# Patient Record
Sex: Male | Born: 1970
Health system: Southern US, Community
[De-identification: ages and names within clinical notes are randomized; demographics above are authoritative.]

## PROBLEM LIST (undated history)

## (undated) HISTORY — PX: APPENDECTOMY: SHX54

---

## 2014-11-13 ENCOUNTER — Emergency Department (HOSPITAL_COMMUNITY): Payer: BLUE CROSS/BLUE SHIELD

## 2014-11-13 ENCOUNTER — Emergency Department (HOSPITAL_COMMUNITY)
Admission: EM | Admit: 2014-11-13 | Discharge: 2014-11-13 | Disposition: A | Discharge status: Home / Self Care | Payer: BLUE CROSS/BLUE SHIELD | Attending: Emergency Medicine | Admitting: Emergency Medicine

## 2014-11-13 ENCOUNTER — Encounter (HOSPITAL_COMMUNITY): Payer: Self-pay

## 2014-11-13 DIAGNOSIS — R51 Headache: Secondary | ICD-10-CM | POA: Insufficient documentation

## 2014-11-13 DIAGNOSIS — R1110 Vomiting, unspecified: Secondary | ICD-10-CM | POA: Insufficient documentation

## 2014-11-13 DIAGNOSIS — R519 Headache, unspecified: Secondary | ICD-10-CM

## 2014-11-13 MED ORDER — DIPHENHYDRAMINE HCL 50 MG/ML IJ SOLN
25.0000 mg | Freq: Once | INTRAMUSCULAR | Status: AC
  Administered 2014-11-13: 25 mg via INTRAMUSCULAR
  Filled 2014-11-13: qty 1

## 2014-11-13 MED ORDER — KETOROLAC TROMETHAMINE 60 MG/2ML IM SOLN
60.0000 mg | Freq: Once | INTRAMUSCULAR | Status: AC
  Administered 2014-11-13: 60 mg via INTRAMUSCULAR
  Filled 2014-11-13: qty 2

## 2014-11-13 MED ORDER — METOCLOPRAMIDE HCL 5 MG/ML IJ SOLN
10.0000 mg | Freq: Once | INTRAMUSCULAR | Status: AC
  Administered 2014-11-13: 10 mg via INTRAMUSCULAR
  Filled 2014-11-13: qty 2

## 2014-11-13 NOTE — ED Provider Notes (Signed)
CSN: [id number]     Arrival date & time 11/13/14  2683 History   First MD Initiated Contact with Patient 11/13/14 [phone]     Chief Complaint  Patient presents with  . Headache     (Consider location/radiation/quality/duration/timing/severity/associated sxs/prior Treatment) HPI Comments: Pt comes in with c/o headache that has been ongoing for 15 days. He states that he has not had fever, sore throat, blurred vision. He does have vomiting. Has taken tylenol and motrin without relief. Has history of headache. States that he used glassed previously for the headaches for 6 months but then he stopped. He feels like his eyes are coming out when his headache is intense. Not acute in nature  The history is provided by the patient. A language interpreter was used.    History reviewed. No pertinent past medical history. Past Surgical History  Procedure Laterality Date  . Appendectomy     History reviewed. No pertinent family history. History  Substance Use Topics  . Smoking status: Never Smoker   . Smokeless tobacco: Not on file  . Alcohol Use: No    Review of Systems  All other systems reviewed and are negative.     Allergies  Review of patient's allergies indicates no known allergies.  Home Medications   Prior to Admission medications   Not on File   BP 139/91 mmHg  Pulse 77  Temp(Src) 98.8 F (37.1 C) (Oral)  Resp 20  Ht 5' 9"  (1.753 m)  Wt 159 lb 9.8 oz (72.4 kg)  BMI 23.56 kg/m2  SpO2 100% Physical Exam  Constitutional: He is oriented to person, place, and time. He appears well-developed and well-nourished.  HENT:  Head: Normocephalic and atraumatic.  Right Ear: External ear normal.  Left Ear: External ear normal.  Eyes: Conjunctivae and EOM are normal. Pupils are equal, round, and reactive to light.  Neck: Normal range of motion. Neck supple.  Cardiovascular: Normal rate and regular rhythm.   Pulmonary/Chest: Effort normal and breath sounds normal.  Abdominal:  Soft. Bowel sounds are normal.  Musculoskeletal: Normal range of motion.  Neurological: He is alert and oriented to person, place, and time. Coordination normal.  Skin: Skin is warm and dry.  Psychiatric: He has a normal mood and affect.  Nursing note and vitals reviewed.   ED Course  Procedures (including critical care time) Labs Review Labs Reviewed - No data to display  Imaging Review Ct Head Wo Contrast  11/13/2014   CLINICAL DATA:  Headache for 15 days.  Emesis.  EXAM: CT HEAD WITHOUT CONTRAST  TECHNIQUE: Contiguous axial images were obtained from the base of the skull through the vertex without intravenous contrast.  COMPARISON:  None.  FINDINGS: Sinuses/Soft tissues: Clear paranasal sinuses and mastoid air cells.  Intracranial: No mass lesion, hemorrhage, hydrocephalus, acute infarct, intra-axial, or extra-axial fluid collection.  IMPRESSION: Normal head CT.   Electronically Signed   By: Abigail Miyamoto M.D.   On: 11/13/2014 08:13     EKG Interpretation None      MDM   Final diagnoses:  Headache    Pt feeling better after migraine cocktail of toradol, reglan and benadryl. Not the worst headache or acute onset in nature. Discussed follow up       Glendell Docker, NP 11/13/14 2229  Ernestina Patches, MD 11/14/14 [phone]

## 2014-11-13 NOTE — ED Notes (Signed)
Pt here for headache for 15 days, pt reports emesis too. Pt sts he had glasses rx for 6 months and it helped but after 6 months they ran out and he no longer used them. Pt has taken tylenol and motrin for same.

## 2014-11-13 NOTE — Discharge Instructions (Signed)

## 2016-05-11 ENCOUNTER — Emergency Department (HOSPITAL_COMMUNITY)
Admission: EM | Admit: 2016-05-11 | Discharge: 2016-05-12 | Disposition: A | Discharge status: Home / Self Care | Payer: BLUE CROSS/BLUE SHIELD | Attending: Emergency Medicine | Admitting: Emergency Medicine

## 2016-05-11 ENCOUNTER — Encounter (HOSPITAL_COMMUNITY): Payer: Self-pay | Admitting: Emergency Medicine

## 2016-05-11 DIAGNOSIS — M545 Low back pain, unspecified: Secondary | ICD-10-CM

## 2016-05-11 LAB — I-STAT CHEM 8, ED
BUN: 5 mg/dL — ABNORMAL LOW (ref 6–20)
CALCIUM ION: 1.17 mmol/L (ref 1.15–1.40)
Chloride: 101 mmol/L (ref 101–111)
Creatinine, Ser: 0.9 mg/dL (ref 0.61–1.24)
Glucose, Bld: 82 mg/dL (ref 65–99)
HEMATOCRIT: 45 % (ref 39.0–52.0)
HEMOGLOBIN: 15.3 g/dL (ref 13.0–17.0)
Potassium: 3.4 mmol/L — ABNORMAL LOW (ref 3.5–5.1)
Sodium: 143 mmol/L (ref 135–145)
TCO2: 26 mmol/L (ref 0–100)

## 2016-05-11 MED ORDER — OXYCODONE-ACETAMINOPHEN 5-325 MG PO TABS
1.0000 | ORAL_TABLET | ORAL | Status: DC | PRN
  Administered 2016-05-11: 1 via ORAL

## 2016-05-11 MED ORDER — PREDNISONE 20 MG PO TABS
60.0000 mg | ORAL_TABLET | Freq: Once | ORAL | Status: AC
  Administered 2016-05-11: 60 mg via ORAL
  Filled 2016-05-11: qty 3

## 2016-05-11 MED ORDER — KETOROLAC TROMETHAMINE 60 MG/2ML IM SOLN
60.0000 mg | Freq: Once | INTRAMUSCULAR | Status: AC
  Administered 2016-05-11: 60 mg via INTRAMUSCULAR
  Filled 2016-05-11: qty 2

## 2016-05-11 MED ORDER — NAPROXEN 500 MG PO TABS: 500.0000 mg | ORAL_TABLET | Freq: Two times a day (BID) | ORAL | 0 refills | Status: DC

## 2016-05-11 MED ORDER — OXYCODONE-ACETAMINOPHEN 5-325 MG PO TABS
ORAL_TABLET | ORAL | Status: DC
Start: 2016-05-11 — End: 2016-05-12
  Filled 2016-05-11: qty 1

## 2016-05-11 MED ORDER — CYCLOBENZAPRINE HCL 10 MG PO TABS
5.0000 mg | ORAL_TABLET | Freq: Once | ORAL | Status: AC
  Administered 2016-05-11: 5 mg via ORAL
  Filled 2016-05-11: qty 1

## 2016-05-11 MED ORDER — CYCLOBENZAPRINE HCL 10 MG PO TABS: 5.0000 mg | ORAL_TABLET | Freq: Two times a day (BID) | ORAL | 0 refills | Status: DC | PRN

## 2016-05-11 NOTE — ED Triage Notes (Addendum)
Pt speaks Nepali. Pt reports this morning he went to work and began having left sided lower back pain radiating down to left leg. Denies any injury to back. Pt Hypertensive at triage. Denies history of HTN.

## 2016-05-11 NOTE — ED Provider Notes (Signed)
[facility] DEPT Provider Note   CSN: [id number] Arrival date & time: 05/11/16  1438     History   Chief Complaint Chief Complaint  Patient presents with  . Back Pain    HPI Duane Heath is a 45 y.o. male.  HPI   Patient presents to the ER with complaints of left low back pain. He speaks [place] and the interview was done with a translator phone.  He says it started this morning, He does not remember doing anything to hurt his back, but it hurts with movement only. He does not have pain at rest. Not had any dysuria, hematuria, abdominal pain, nausea, vomiting, diarrhea. It does not hurt to touch. He denies having a rash. He did not try anything at home for the pain prior to arrival.  Denies weakness but does say walking hurts. He is hypertensive in triage and denies hx.  History reviewed. No pertinent past medical history.  There are no active problems to display for this patient.   Past Surgical History:  Procedure Laterality Date  . APPENDECTOMY         Home Medications    Prior to Admission medications   Medication Sig Start Date End Date Taking? Authorizing Provider  cyclobenzaprine (FLEXERIL) 10 MG tablet Take 0.5-1 tablets (5-10 mg total) by mouth 2 (two) times daily as needed. 05/11/16   Devrin Monforte Carlota Raspberry, PA-C  naproxen (NAPROSYN) 500 MG tablet Take 1 tablet (500 mg total) by mouth 2 (two) times daily. 05/11/16   Delos Haring, PA-C    Family History No family history on file.  Social History Social History  Substance Use Topics  . Smoking status: Never Smoker  . Smokeless tobacco: Not on file  . Alcohol use No     Allergies   Review of patient's allergies indicates no known allergies.   Review of Systems Review of Systems   Physical Exam Updated Vital Signs BP (!) 153/103   Pulse 75   Temp 98.4 F (36.9 C) (Oral)   Resp 20   Ht 5' 3"  (1.6 m)   SpO2 100%   Physical Exam  Constitutional: He appears well-developed and well-nourished. No  distress.  HENT:  Head: Normocephalic and atraumatic.  Eyes: Pupils are equal, round, and reactive to light.  Neck: Normal range of motion. Neck supple.  Cardiovascular: Normal rate and regular rhythm.   Pulmonary/Chest: Effort normal.  Abdominal: Soft.  Musculoskeletal:  Symmetrical and physiologic strength to bilateral lower extremities.  Neurosensory function adequate to both legs Skin color is normal. Skin is warm and moist.  No step off deformity appreciated and no midline bony tenderness.  Ambulatory  No crepitus, laceration, effusion, induration, lesions Pedal pulses are symmetrical and palpable bilaterally   No tenderness to palpation of back or hips.    Neurological: He is alert.  Skin: Skin is warm and dry.  Nursing note and vitals reviewed.  Review of Systems All other systems negative except as documented in the HPI. All pertinent positives and negatives as reviewed in the HPI.   ED Treatments / Results  Labs (all labs ordered are listed, but only abnormal results are displayed) Labs Reviewed  I-STAT CHEM 8, ED - Abnormal; Notable for the following:       Result Value   Potassium 3.4 (*)    BUN 5 (*)    All other components within normal limits  URINALYSIS, ROUTINE W REFLEX MICROSCOPIC (NOT AT [facility])    EKG  EKG Interpretation None  Radiology No results found.  Procedures Procedures (including critical care time)  Medications Ordered in ED Medications  oxyCODONE-acetaminophen (PERCOCET/ROXICET) 5-325 MG per tablet 1 tablet (1 tablet Oral Given 05/11/16 1554)  oxyCODONE-acetaminophen (PERCOCET/ROXICET) 5-325 MG per tablet (not administered)  predniSONE (DELTASONE) tablet 60 mg (60 mg Oral Given 05/11/16 2332)  ketorolac (TORADOL) injection 60 mg (60 mg Intramuscular Given 05/11/16 2332)  cyclobenzaprine (FLEXERIL) tablet 5 mg (5 mg Oral Given 05/11/16 2332)     Initial Impression / Assessment and Plan / ED Course  I have reviewed the triage vital  signs and the nursing notes.  Pertinent labs & imaging results that were available during my care of the patient were reviewed by me and considered in my medical decision making (see chart for details).  Clinical Course    45 y.o.Duane Heath's  with back pain.   No neurological deficits and normal neuro exam. No loss of bowel or bladder control. No concern for cauda equina at this time base on HPI and physical exam findings. No fever, night sweats, weight loss, h/o cancer, IVDU. The patient can walk with some discomfort. --- He is advised to recheck his BP at the pharmacy when he is no longer hurting. If he is still hypertensive he has been given PCP referral and sdvised to make an appointment.  Patient Plan 1. Medications: NSAIDs and/or muscle relaxer. Cont usual home medications unless otherwise directed. 2. Treatment: rest, drink plenty of fluids, gentle stretching as discussed, alternate ice and heat  3. Follow Up: Please followup with your primary doctor for discussion of your diagnoses and further evaluation after today's visit; if you do not have a primary care doctor use the resource guide provided to find one  Advised to follow-up with the orthopedist if symptoms do not start to resolve in the next 2-3 days. If develop loss of bowel or urinary control return to the ED as soon as possible for further evaluation. To take the medications as prescribed as they can cause harm if not taken appropriately.   Vital signs are stable at discharge. Vitals:   05/11/16 2039 05/11/16 2247  BP: (!) 159/119 (!) 153/103  Pulse: 75 75  Resp: 20   Temp: 98.4 F (36.9 C)     Patient/guardian has voiced understanding and agreed to follow-up with the PCP or specialist.   Final Clinical Impressions(s) / ED Diagnoses   Final diagnoses:  Left-sided low back pain without sciatica    New Prescriptions New Prescriptions   CYCLOBENZAPRINE (FLEXERIL) 10 MG TABLET    Take 0.5-1 tablets (5-10 mg total)  by mouth 2 (two) times daily as needed.   NAPROXEN (NAPROSYN) 500 MG TABLET    Take 1 tablet (500 mg total) by mouth 2 (two) times daily.      Delos Haring, PA-C 05/12/16 0150    Duffy Bruce, MD 05/12/16 [phone]

## 2016-05-12 LAB — URINALYSIS, ROUTINE W REFLEX MICROSCOPIC
BILIRUBIN URINE: NEGATIVE
GLUCOSE, UA: NEGATIVE mg/dL
Hgb urine dipstick: NEGATIVE
KETONES UR: NEGATIVE mg/dL
LEUKOCYTES UA: NEGATIVE
Nitrite: NEGATIVE
PROTEIN: NEGATIVE mg/dL
Specific Gravity, Urine: 1.008 (ref 1.005–1.030)
pH: 7.5 (ref 5.0–8.0)

## 2016-09-22 ENCOUNTER — Ambulatory Visit: Payer: BLUE CROSS/BLUE SHIELD | Admitting: Family Medicine

## 2016-12-19 ENCOUNTER — Ambulatory Visit (INDEPENDENT_AMBULATORY_CARE_PROVIDER_SITE_OTHER): Payer: BLUE CROSS/BLUE SHIELD | Admitting: Physician Assistant

## 2017-01-18 ENCOUNTER — Other Ambulatory Visit: Payer: Self-pay

## 2017-01-18 ENCOUNTER — Emergency Department (HOSPITAL_COMMUNITY): Payer: BLUE CROSS/BLUE SHIELD

## 2017-01-18 ENCOUNTER — Emergency Department (HOSPITAL_COMMUNITY)
Admission: EM | Admit: 2017-01-18 | Discharge: 2017-01-18 | Disposition: A | Discharge status: Home / Self Care | Payer: BLUE CROSS/BLUE SHIELD | Attending: Emergency Medicine | Admitting: Emergency Medicine

## 2017-01-18 DIAGNOSIS — R55 Syncope and collapse: Secondary | ICD-10-CM | POA: Insufficient documentation

## 2017-01-18 DIAGNOSIS — Z79899 Other long term (current) drug therapy: Secondary | ICD-10-CM | POA: Insufficient documentation

## 2017-01-18 DIAGNOSIS — R42 Dizziness and giddiness: Secondary | ICD-10-CM | POA: Diagnosis present

## 2017-01-18 DIAGNOSIS — R030 Elevated blood-pressure reading, without diagnosis of hypertension: Secondary | ICD-10-CM | POA: Diagnosis not present

## 2017-01-18 LAB — BASIC METABOLIC PANEL
Anion gap: 8 (ref 5–15)
BUN: 7 mg/dL (ref 6–20)
CO2: 24 mmol/L (ref 22–32)
Calcium: 9.6 mg/dL (ref 8.9–10.3)
Chloride: 107 mmol/L (ref 101–111)
Creatinine, Ser: 0.77 mg/dL (ref 0.61–1.24)
GFR calc Af Amer: >60 mL/min (ref >60)
GFR calc non Af Amer: >60 mL/min (ref >60)
GLUCOSE: 83 mg/dL (ref 65–99)
Potassium: 3.7 mmol/L (ref 3.5–5.1)
SODIUM: 139 mmol/L (ref 135–145)

## 2017-01-18 LAB — URINALYSIS, ROUTINE W REFLEX MICROSCOPIC
Bilirubin Urine: NEGATIVE
GLUCOSE, UA: NEGATIVE mg/dL
HGB URINE DIPSTICK: NEGATIVE
Ketones, ur: NEGATIVE mg/dL
Leukocytes, UA: NEGATIVE
Nitrite: NEGATIVE
PROTEIN: NEGATIVE mg/dL
Specific Gravity, Urine: 1.002 — ABNORMAL LOW (ref 1.005–1.030)
pH: 7 (ref 5.0–8.0)

## 2017-01-18 LAB — CBC WITH DIFFERENTIAL/PLATELET
BASOS PCT: 0 %
Basophils Absolute: 0 10*3/uL (ref 0.0–0.1)
EOS ABS: 0.1 10*3/uL (ref 0.0–0.7)
Eosinophils Relative: 2 %
HCT: 42.1 % (ref 39.0–52.0)
Hemoglobin: 15.2 g/dL (ref 13.0–17.0)
Lymphocytes Relative: 25 %
Lymphs Abs: 1.6 10*3/uL (ref 0.7–4.0)
MCH: 28.9 pg (ref 26.0–34.0)
MCHC: 36.1 g/dL — ABNORMAL HIGH (ref 30.0–36.0)
MCV: 80 fL (ref 78.0–100.0)
Monocytes Absolute: 0.4 10*3/uL (ref 0.1–1.0)
Monocytes Relative: 6 %
Neutro Abs: 4.3 10*3/uL (ref 1.7–7.7)
Neutrophils Relative %: 67 %
Platelets: 260 10*3/uL (ref 150–400)
RBC: 5.26 MIL/uL (ref 4.22–5.81)
RDW: 11.8 % (ref 11.5–15.5)
WBC: 6.4 10*3/uL (ref 4.0–10.5)

## 2017-01-18 LAB — I-STAT TROPONIN, ED: Troponin i, poc: 0 ng/mL (ref 0.00–0.08)

## 2017-01-18 MED ORDER — SODIUM CHLORIDE 0.9 % IV BOLUS (SEPSIS)
1000.0000 mL | Freq: Once | INTRAVENOUS | Status: AC
  Administered 2017-01-18: 1000 mL via INTRAVENOUS

## 2017-01-18 NOTE — ED Triage Notes (Signed)
Pt BIB [facility] from work where pt reports sudden on set of dizziness with a funny feeling in chest, pt Denies CP. EMS reports pt was hypertensive and diaphoretic upon arrival. Pt denies medical history, denies allergies, denies daily medications.

## 2017-01-18 NOTE — ED Notes (Signed)
Patient transported to X-ray 

## 2017-01-18 NOTE — ED Provider Notes (Signed)
[facility] DEPT Provider Note   CSN: [id number] Arrival date & time: 01/18/17  1224     History   Chief Complaint Chief Complaint  Patient presents with  . Dizziness    HPI Duane Heath is a 46 y.o. male.  Duane Heath is a 46 y.o. Male who reports to the emergency department via EMS after a near syncopal episode prior to arrival today. 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 He tells me he felt like he might pass out. He did not fall down or lose consciousness. He told EMS he had "funny feeling" in his chest but denied chest pain. With me he denies chest pain or shortness of breath. He reports currently his symptoms have resolved. He denies room spinning dizziness. No treatments attempted prior to arrival. He denies fevers, headache, recent illness, recent long travel, smoking, history of MI, numbness, weakness, double vision, abdominal pain, vomiting, diarrhea, chest pain, palpitations or rashes.   The history is provided by the patient and medical records. The history is limited by a language barrier. A language interpreter was used.  Dizziness  Associated symptoms: no chest pain, no diarrhea, no headaches, no nausea, no palpitations, no shortness of breath, no vomiting and no weakness     No past medical history on file.  There are no active problems to display for this patient.   Past Surgical History:  Procedure Laterality Date  . APPENDECTOMY         Home Medications    Prior to Admission medications   Medication Sig Start Date End Date Taking? Authorizing Provider  cyclobenzaprine (FLEXERIL) 10 MG tablet Take 0.5-1 tablets (5-10 mg total) by mouth 2 (two) times daily as needed. Patient not taking: Reported on 01/18/2017 05/11/16   Delos Haring, PA-C  naproxen (NAPROSYN) 500 MG tablet Take 1 tablet (500 mg total) by mouth 2 (two) times daily. Patient not taking: Reported on 01/18/2017 05/11/16    Delos Haring, PA-C    Family History No family history on file.  Social History Social History  Substance Use Topics  . Smoking status: Never Smoker  . Smokeless tobacco: Not on file  . Alcohol use No     Allergies   Patient has no known allergies.   Review of Systems Review of Systems  Constitutional: Negative for chills and fever.  HENT: Negative for congestion and sore throat.   Eyes: Negative for visual disturbance.  Respiratory: Negative for cough and shortness of breath.   Cardiovascular: Negative for chest pain and palpitations.  Gastrointestinal: Negative for abdominal pain, diarrhea, nausea and vomiting.  Genitourinary: Negative for dysuria.  Musculoskeletal: Negative for back pain and neck pain.  Skin: Negative for rash.  Neurological: Positive for syncope (Near syncope.) and light-headedness. Negative for dizziness, speech difficulty, weakness, numbness and headaches.     Physical Exam Updated Vital Signs BP (!) 148/102   Pulse 74   Temp 98.4 F (36.9 C) (Oral)   Resp 14   Ht 5' 4"  (1.626 m)   Wt 72.4 kg   SpO2 96%   BMI 27.40 kg/m   Physical Exam  Constitutional: He is oriented to person, place, and time. He appears well-developed and well-nourished. No distress.  Nontoxic appearing.  HENT:  Head: Normocephalic and atraumatic.  Right Ear: External ear normal.  Left Ear: External ear normal.  Mouth/Throat: Oropharynx is clear and moist.  Eyes: Conjunctivae and EOM are normal. Pupils are  equal, round, and reactive to light. Right eye exhibits no discharge. Left eye exhibits no discharge.  Neck: Normal range of motion. Neck supple. No JVD present. No tracheal deviation present.  Cardiovascular: Normal rate, regular rhythm, normal heart sounds and intact distal pulses.  Exam reveals no gallop and no friction rub.   No murmur heard. Bilateral radial pulses are intact.  Pulmonary/Chest: Effort normal and breath sounds normal. No stridor. No  respiratory distress. He has no wheezes. He has no rales.  Lungs are clear to ascultation bilaterally. Symmetric chest expansion bilaterally. No increased work of breathing. No rales or rhonchi.    Abdominal: Soft. There is no tenderness. There is no guarding.  Musculoskeletal: Normal range of motion. He exhibits no edema or tenderness.  Lymphadenopathy:    He has no cervical adenopathy.  Neurological: He is alert and oriented to person, place, and time. No cranial nerve deficit or sensory deficit. He exhibits normal muscle tone. Coordination normal.  Patient is alert and oriented 3. Cranial nerves are intact. Speech is clear and coherent. No pronator drift. Finger to nose intact bilaterally. Good strength and sensation to his bilateral upper and lower extremities.  Skin: Skin is warm and dry. Capillary refill takes less than 2 seconds. No rash noted. He is not diaphoretic. No erythema. No pallor.  Psychiatric: He has a normal mood and affect. His behavior is normal.  Nursing note and vitals reviewed.    ED Treatments / Results  Labs (all labs ordered are listed, but only abnormal results are displayed) Labs Reviewed  CBC WITH DIFFERENTIAL/PLATELET - Abnormal; Notable for the following:       Result Value   MCHC 36.1 (*)    All other components within normal limits  URINALYSIS, ROUTINE W REFLEX MICROSCOPIC - Abnormal; Notable for the following:    Color, Urine COLORLESS (*)    Specific Gravity, Urine 1.002 (*)    All other components within normal limits  BASIC METABOLIC PANEL  I-STAT TROPOININ, ED    EKG  EKG Interpretation  Date/Time:  Thursday Jan 18 2017 13:01:32 EDT Ventricular Rate:  67 PR Interval:    QRS Duration: 85 QT Interval:  376 QTC Calculation: 397 R Axis:   49 Text Interpretation:  Sinus rhythm Borderline repolarization abnormality No significant change since last tracing Confirmed by Theotis Burrow [id number]) on 01/18/2017 3:59:13 PM       Radiology Dg  Chest 2 View  Result Date: 01/18/2017 CLINICAL DATA:  Dizziness EXAM: CHEST  2 VIEW COMPARISON:  None. FINDINGS: The heart size and mediastinal contours are within normal limits. Both lungs are clear. The visualized skeletal structures are unremarkable. IMPRESSION: No active cardiopulmonary disease. Electronically Signed   By: Inez Catalina M.D.   On: 01/18/2017 13:40    Procedures Procedures (including critical care time)  Medications Ordered in ED Medications  sodium chloride 0.9 % bolus 1,000 mL (1,000 mLs Intravenous New Bag/Given 01/18/17 1429)     Initial Impression / Assessment and Plan / ED Course  I have reviewed the triage vital signs and the nursing notes.  Pertinent labs & imaging results that were available during my care of the patient were reviewed by me and considered in my medical decision making (see chart for details).    This is a 46 y.o. Male who reports to the emergency department via EMS after a near syncopal episode prior to arrival today. 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 He tells me he felt like he might pass out. He did not fall down or lose consciousness. He told EMS he had "funny feeling" in his chest but denied chest pain. With me he denies chest pain or shortness of breath. He reports currently his symptoms have resolved. He denies room spinning dizziness.  On exam the patient is afebrile nontoxic appearing. He has no focal neurological deficits. EKG is unchanged from his last tracing. No STEMI. He reports no symptoms currently. He is hypertensive. Troponin is not elevated. Urinalysis is without sign of infection. BMP is within normal limits. CBC is unremarkable. No leukocytosis. Chest x-ray is unremarkable. The patient is not orthostatic. He is PERC negative and Wells negative. I have low suspicion for ACS, or PE in this patient. Work up is reassuring. At reevaluation patient reports  feeling better. He still denies any current complaints. He ambulates with normal gait and without symptoms of lightheadedness. He feels ready for discharge. We'll discharge at this time. I did encourage him to follow-up with primary care due to his elevated blood pressure. I discussed strict and specific return precautions. I advised the patient to follow-up with their primary care provider this week. I advised the patient to return to the emergency department with new or worsening symptoms or new concerns. The patient verbalized understanding and agreement with plan.      Final Clinical Impressions(s) / ED Diagnoses   Final diagnoses:  Near syncope  Elevated blood pressure reading    New Prescriptions New Prescriptions   No medications on file     Waynetta Pean, Hershal Coria 01/18/17 [place], Wenda Overland, MD 01/18/17 [phone]

## 2017-01-23 ENCOUNTER — Encounter (INDEPENDENT_AMBULATORY_CARE_PROVIDER_SITE_OTHER): Payer: Self-pay | Admitting: Physician Assistant

## 2017-01-23 ENCOUNTER — Ambulatory Visit (INDEPENDENT_AMBULATORY_CARE_PROVIDER_SITE_OTHER): Payer: BLUE CROSS/BLUE SHIELD | Admitting: Physician Assistant

## 2017-01-23 VITALS — BP 142/95 | HR 76 | Temp 97.6°F | Ht 67.0 in | Wt 197.8 lb

## 2017-01-23 DIAGNOSIS — R350 Frequency of micturition: Secondary | ICD-10-CM

## 2017-01-23 DIAGNOSIS — I10 Essential (primary) hypertension: Secondary | ICD-10-CM | POA: Diagnosis not present

## 2017-01-23 DIAGNOSIS — Z114 Encounter for screening for human immunodeficiency virus [HIV]: Secondary | ICD-10-CM

## 2017-01-23 DIAGNOSIS — Z09 Encounter for follow-up examination after completed treatment for conditions other than malignant neoplasm: Secondary | ICD-10-CM | POA: Diagnosis not present

## 2017-01-23 LAB — POCT URINALYSIS DIPSTICK
BILIRUBIN UA: NEGATIVE
Glucose, UA: NEGATIVE
KETONES UA: NEGATIVE
LEUKOCYTES UA: NEGATIVE
NITRITE UA: NEGATIVE
PH UA: 7 (ref 5.0–8.0)
Spec Grav, UA: 1.015 (ref 1.010–1.025)
Urobilinogen, UA: 0.2 E.U./dL

## 2017-01-23 MED ORDER — HYDROCHLOROTHIAZIDE 25 MG PO TABS: 25.0000 mg | ORAL_TABLET | Freq: Every day | ORAL | 1 refills | Status: DC

## 2017-01-23 NOTE — Progress Notes (Signed)
  Subjective:  Patient ID: Duane Heath, male    DOB: 12/18/1970  Age: 46 y.o. MRN: [id number]  CC: ED f/u for near syncope  HPI Duane Heath is a 46 y.o. male with no significant PMH presents for f/u of ED visit on 01/18/17. Diagnosed with near syncope. Labs, CXR, and EKG normal/unremarkable at the ED. Has no current sxs that led him to the ED. Only a mild HA since ED and urinary frequency of 5-6 per day. Here to address his blood pressure. Denies chest pain, palpitations, SOB, abdominal pain, rash, f/c/n/v, or GI/GU sxs.   *[place] interpreter used during the visit.   ROS Review of Systems  Constitutional: Negative for chills, fever and malaise/fatigue.  Eyes: Negative for blurred vision.  Respiratory: Negative for shortness of breath.   Cardiovascular: Negative for chest pain, palpitations, orthopnea, claudication, leg swelling and PND.  Gastrointestinal: Negative for abdominal pain and nausea.  Genitourinary: Positive for frequency. Negative for dysuria, flank pain, hematuria and urgency.  Musculoskeletal: Negative for back pain, joint pain, myalgias and neck pain.  Skin: Negative for rash.  Neurological: Positive for headaches. Negative for tingling.  Psychiatric/Behavioral: Negative for depression. The patient is not nervous/anxious.     Objective:  BP (!) 142/95   Pulse 76   Temp 97.6 F (36.4 C) (Oral)   Ht 5' 7"  (1.702 m)   Wt 197 lb 12.8 oz (89.7 kg)   SpO2 98%   BMI 30.98 kg/m   BP/Weight 01/23/2017 7/65/4650 11/07/4654  Systolic BP 812 751 700  Diastolic BP 95 78 174  Wt. (Lbs) 197.8 159.61 -  BMI 30.98 27.4 -      Physical Exam  Constitutional: He is oriented to person, place, and time.  Well developed, overweight, NAD, polite  HENT:  Head: Normocephalic and atraumatic.  Eyes: No scleral icterus.  Neck: Normal range of motion. Neck supple. No thyromegaly present.  Cardiovascular: Normal rate, regular rhythm and normal heart sounds.   Pulmonary/Chest: Effort normal  and breath sounds normal.  Abdominal: Soft. Bowel sounds are normal. There is no tenderness.  Musculoskeletal: He exhibits no edema.  Lymphadenopathy:    He has no cervical adenopathy.  Neurological: He is alert and oriented to person, place, and time. No cranial nerve deficit. Coordination normal.  Skin: Skin is warm and dry. No rash noted. No erythema. No pallor.  Psychiatric: He has a normal mood and affect. His behavior is normal. Thought content normal.  Vitals reviewed.    Assessment & Plan:   1. Hypertension, unspecified type - HIV antibody - Hepatic Function Panel - TSH - Begin HCTZ 25 q am  2. Hospital discharge follow-up - HIV antibody - Hepatic Function Panel - ED notes read  3. Encounter for screening for HIV - HIV antibody  4. Urinary frequency - Urinalysis Dipstick positive for trace protein and trace blood. Recommend retest in 2-4 weeks.   Meds ordered this encounter  Medications  . hydrochlorothiazide (HYDRODIURIL) 25 MG tablet    Sig: Take 1 tablet (25 mg total) by mouth daily. Take on tablet in the morning.    Dispense:  90 tablet    Refill:  1    Order Specific Question:   Supervising Provider    Answer:   Duane Heath [id number]    Follow-up: Return in about 2 weeks (around 02/06/2017).   [name]

## 2017-01-23 NOTE — Patient Instructions (Signed)

## 2017-01-24 LAB — HEPATIC FUNCTION PANEL
ALK PHOS: 77 IU/L (ref 39–117)
ALT: 59 IU/L — ABNORMAL HIGH (ref 0–44)
AST: 51 IU/L — AB (ref 0–40)
Albumin: 4.7 g/dL (ref 3.5–5.5)
Bilirubin Total: 0.8 mg/dL (ref 0.0–1.2)
Bilirubin, Direct: 0.23 mg/dL (ref 0.00–0.40)
Total Protein: 7.7 g/dL (ref 6.0–8.5)

## 2017-01-24 LAB — TSH: TSH: 1.97 u[IU]/mL (ref 0.450–4.500)

## 2017-01-24 LAB — HIV ANTIBODY (ROUTINE TESTING W REFLEX): HIV Screen 4th Generation wRfx: NONREACTIVE

## 2017-02-01 ENCOUNTER — Encounter (INDEPENDENT_AMBULATORY_CARE_PROVIDER_SITE_OTHER): Payer: Self-pay

## 2017-02-13 ENCOUNTER — Ambulatory Visit (INDEPENDENT_AMBULATORY_CARE_PROVIDER_SITE_OTHER): Payer: BLUE CROSS/BLUE SHIELD | Admitting: Physician Assistant

## 2017-03-08 ENCOUNTER — Ambulatory Visit (INDEPENDENT_AMBULATORY_CARE_PROVIDER_SITE_OTHER): Payer: BLUE CROSS/BLUE SHIELD | Admitting: Physician Assistant

## 2017-03-12 ENCOUNTER — Encounter (INDEPENDENT_AMBULATORY_CARE_PROVIDER_SITE_OTHER): Payer: Self-pay | Admitting: Physician Assistant

## 2017-03-12 ENCOUNTER — Ambulatory Visit (INDEPENDENT_AMBULATORY_CARE_PROVIDER_SITE_OTHER): Payer: BLUE CROSS/BLUE SHIELD | Admitting: Physician Assistant

## 2017-03-12 VITALS — BP 100/65 | HR 85 | Temp 98.4°F | Wt 197.8 lb

## 2017-03-12 DIAGNOSIS — Z23 Encounter for immunization: Secondary | ICD-10-CM

## 2017-03-12 DIAGNOSIS — I10 Essential (primary) hypertension: Secondary | ICD-10-CM

## 2017-03-12 MED ORDER — HYDROCHLOROTHIAZIDE 25 MG PO TABS: 25.0000 mg | ORAL_TABLET | Freq: Every day | ORAL | 1 refills | Status: DC

## 2017-03-12 NOTE — Progress Notes (Signed)
   Subjective:  Patient ID: Duane Heath, male    DOB: May 30, 1971  Age: 46 y.o. MRN: [id number]  CC: HTN  HPI Duane Heath is a 46 y.o. male with a PMH of HTN presents to f/u on HTN. Has been taking HCTZ 25 mg as directed. Does not check BP at home. Feels well and is without complaints. Denies CP, palpitations, SOB, HA, abdominal pain, fever, chills, nausea, vomiting, rash, edema, presyncope, syncope, dizziness, tingling, numbness, or GI/GU sxs.    Outpatient Medications Prior to Visit  Medication Sig Dispense Refill  . hydrochlorothiazide (HYDRODIURIL) 25 MG tablet Take 1 tablet (25 mg total) by mouth daily. Take on tablet in the morning. 90 tablet 1   No facility-administered medications prior to visit.      ROS Review of Systems  Constitutional: Negative for chills, fever and malaise/fatigue.  Eyes: Negative for blurred vision.  Respiratory: Negative for shortness of breath.   Cardiovascular: Negative for chest pain and palpitations.  Gastrointestinal: Negative for abdominal pain and nausea.  Genitourinary: Negative for dysuria and hematuria.  Musculoskeletal: Negative for joint pain and myalgias.  Skin: Negative for rash.  Neurological: Negative for tingling and headaches.  Psychiatric/Behavioral: Negative for depression. The patient is not nervous/anxious.     Objective:  BP 100/65 (BP Location: Left Arm, Patient Position: Sitting, Cuff Size: Normal)   Pulse 85   Temp 98.4 F (36.9 C) (Oral)   Wt 197 lb 12.8 oz (89.7 kg)   SpO2 97%   BMI 30.98 kg/m   BP/Weight 03/12/2017 01/23/2017 04/23/8137  Systolic BP 871 959 747  Diastolic BP 65 95 78  Wt. (Lbs) 197.8 197.8 159.61  BMI 30.98 30.98 27.4      Physical Exam  Constitutional: He is oriented to person, place, and time.  Well developed, overweight, NAD, polite  HENT:  Head: Normocephalic and atraumatic.  Eyes: No scleral icterus.  Neck: Normal range of motion. Neck supple. No thyromegaly present.  Cardiovascular:  Normal rate, regular rhythm and normal heart sounds.   Pulmonary/Chest: Effort normal and breath sounds normal.  Abdominal: Soft. Bowel sounds are normal. There is no tenderness.  Musculoskeletal: He exhibits no edema.  Neurological: He is alert and oriented to person, place, and time.  Skin: Skin is warm and dry. No rash noted. No erythema. No pallor.  Psychiatric: He has a normal mood and affect. His behavior is normal. Thought content normal.  Vitals reviewed.    Assessment & Plan:   1. Essential hypertension - hydrochlorothiazide (HYDRODIURIL) 25 MG tablet; Take 1 tablet (25 mg total) by mouth daily. Take on tablet in the morning.  Dispense: 90 tablet; Refill: 1  2. Need for Tdap vaccination - Tdap vaccine greater than or equal to 7yo IM   Meds ordered this encounter  Medications  . hydrochlorothiazide (HYDRODIURIL) 25 MG tablet    Sig: Take 1 tablet (25 mg total) by mouth daily. Take on tablet in the morning.    Dispense:  90 tablet    Refill:  1    Order Specific Question:   Supervising Provider    Answer:   Tresa Garter [id number]    Follow-up: Return in about 6 months (around 09/12/2017) for BP.   [name]

## 2017-03-12 NOTE — Patient Instructions (Signed)
Hypotension Hypotension, commonly called low blood pressure, is when the force of blood pumping through your arteries is too weak. Arteries are blood vessels that carry blood from the heart throughout the body. When blood pressure is too low, you may not get enough blood to your brain or to the rest of your organs. This can cause weakness, light-headedness, rapid heartbeat, and fainting. Depending on the cause and severity, hypotension may be harmless (benign) or cause serious problems (critical). What are the causes? Possible causes of hypotension include:  Blood loss.  Loss of body fluids (dehydration).  Heart problems.  Hormone (endocrine) problems.  Pregnancy.  Severe infection.  Lack of certain nutrients.  Severe allergic reactions (anaphylaxis).  Certain medicines, such as blood pressure medicine or medicines that make the body lose excess fluids (diuretics). Sometimes, hypotension can be caused by not taking medicine as directed, such as taking too much of a certain medicine.  What increases the risk? Certain factors can make you more likely to develop hypotension, including:  Age. Risk increases as you get older.  Conditions that affect the heart or the central nervous system.  Taking certain medicines, such as blood pressure medicine or diuretics.  Being pregnant.  What are the signs or symptoms? Symptoms of this condition may include:  Weakness.  Light-headedness.  Dizziness.  Blurred vision.  Fatigue.  Rapid heartbeat.  Fainting, in severe cases.  How is this diagnosed? This condition is diagnosed based on:  Your medical history.  Your symptoms.  Your blood pressure measurement. Your health care provider will check your blood pressure when you are: ? Lying down. ? Sitting. ? Standing.  A blood pressure reading is recorded as two numbers, such as "120 over 80" (or 120/80). The first ("top") number is called the systolic pressure. It is a  measure of the pressure in your arteries as your heart beats. The second ("bottom") number is called the diastolic pressure. It is a measure of the pressure in your arteries when your heart relaxes between beats. Blood pressure is measured in a unit called mm Hg. Healthy blood pressure for adults is 120/80. If your blood pressure is below 90/60, you may be diagnosed with hypotension. Other information or tests that may be used to diagnose hypotension include:  Your other vital signs, such as your heart rate and temperature.  Blood tests.  Tilt table test. For this test, you will be safely secured to a table that moves you from a lying position to an upright position. Your heart rhythm and blood pressure will be monitored during the test.  How is this treated? Treatment for this condition may include:  Changing your diet. This may involve eating more salt (sodium) or drinking more water.  Taking medicines to raise your blood pressure.  Changing the dosage of certain medicines you are taking that might be lowering your blood pressure.  Wearing compression stockings. These stockings help to prevent blood clots and reduce swelling in your legs.  In some cases, you may need to go to the hospital for:  Fluid replacement. This means you will receive fluids through an IV tube.  Blood replacement. This means you will receive donated blood through an IV tube (transfusion).  Treating an infection or heart problems, if this applies.  Monitoring. You may need to be monitored while medicines that you are taking wear off.  Follow these instructions at home: Eating and drinking   Drink enough fluid to keep your urine clear or pale yellow.  Eat a healthy diet and follow instructions from your health care provider about eating or drinking restrictions. A healthy diet includes: ? Fresh fruits and vegetables. ? Whole grains. ? Lean meats. ? Low-fat dairy products.  Eat extra salt only as  directed. Do not add extra salt to your diet unless your health care provider told you to do that.  Eat frequent, small meals.  Avoid standing up suddenly after eating. Medicines  Take over-the-counter and prescription medicines only as told by your health care provider. ? Follow instructions from your health care provider about changing the dosage of your current medicines, if this applies. ? Do not stop or adjust any of your medicines on your own. General instructions  Wear compression stockings as told by your health care provider.  Get up slowly from lying down or sitting positions. This gives your blood pressure a chance to adjust.  Avoid hot showers and excessive heat as directed by your health care provider.  Return to your normal activities as told by your health care provider. Ask your health care provider what activities are safe for you.  Do not use any products that contain nicotine or tobacco, such as cigarettes and e-cigarettes. If you need help quitting, ask your health care provider.  Keep all follow-up visits as told by your health care provider. This is important. Contact a health care provider if:  You vomit.  You have diarrhea.  You have a fever for more than 2-3 days.  You feel more thirsty than usual.  You feel weak and tired. Get help right away if:  You have chest pain.  You have a fast or irregular heartbeat.  You develop numbness in any part of your body.  You cannot move your arms or your legs.  You have trouble speaking.  You become sweaty or feel light-headed.  You faint.  You feel short of breath.  You have trouble staying awake.  You feel confused. This information is not intended to replace advice given to you by your health care provider. Make sure you discuss any questions you have with your health care provider. Document Released: 08/21/2005 Document Revised: 03/10/2016 Document Reviewed: 02/10/2016 Elsevier Interactive  Patient Education  2018 [employer].

## 2017-04-12 ENCOUNTER — Ambulatory Visit (HOSPITAL_COMMUNITY)
Admission: EM | Admit: 2017-04-12 | Discharge: 2017-04-12 | Disposition: A | Discharge status: Home / Self Care | Payer: BLUE CROSS/BLUE SHIELD | Attending: Internal Medicine | Admitting: Internal Medicine

## 2017-04-12 ENCOUNTER — Encounter (HOSPITAL_COMMUNITY): Payer: Self-pay | Admitting: *Deleted

## 2017-04-12 DIAGNOSIS — R05 Cough: Secondary | ICD-10-CM | POA: Diagnosis not present

## 2017-04-12 DIAGNOSIS — R0982 Postnasal drip: Secondary | ICD-10-CM

## 2017-04-12 DIAGNOSIS — R059 Cough, unspecified: Secondary | ICD-10-CM

## 2017-04-12 DIAGNOSIS — R0789 Other chest pain: Secondary | ICD-10-CM

## 2017-04-12 NOTE — ED Provider Notes (Signed)
[facility]    CSN: [id number] Arrival date & time: 04/12/17  9147     History   Chief Complaint Chief Complaint  Patient presents with  . Cough    HPI Duane Heath is a 46 y.o. male.   46 year old male complaining of cough for a couple days. He states his anterior chest hurts only when he calls. He is also clearing his throat frequently. He does not perceived PND. He thinks he may have had a fever last night but this was subjective and not measured. Denies shortness of breath.      History reviewed. No pertinent past medical history.  There are no active problems to display for this patient.   Past Surgical History:  Procedure Laterality Date  . APPENDECTOMY         Home Medications    Prior to Admission medications   Medication Sig Start Date End Date Taking? Authorizing Provider  hydrochlorothiazide (HYDRODIURIL) 25 MG tablet Take 1 tablet (25 mg total) by mouth daily. Take on tablet in the morning. 03/12/17   Clent Demark, PA-C    Family History History reviewed. No pertinent family history.  Social History Social History  Substance Use Topics  . Smoking status: Never Smoker  . Smokeless tobacco: Never Used  . Alcohol use No     Allergies   Patient has no known allergies.   Review of Systems Review of Systems  Constitutional: Positive for fever.  HENT: Positive for sore throat. Negative for congestion, ear discharge, ear pain, postnasal drip and trouble swallowing.   Respiratory: Positive for cough. Negative for chest tightness.   Cardiovascular: Positive for chest pain.  Gastrointestinal: Negative.   Musculoskeletal: Negative.   Neurological: Negative.   All other systems reviewed and are negative.    Physical Exam Triage Vital Signs ED Triage Vitals [04/12/17 1022]  Enc Vitals Group     BP 140/65     Pulse Rate 89     Resp 16     Temp 98.9 F (37.2 C)     Temp Source Oral     SpO2 99 %     Weight 197 lb (89.4 kg)       Height 5' 3"  (1.6 m)     Head Circumference      Peak Flow      Pain Score      Pain Loc      Pain Edu?      Excl. in [place]?    No data found.   Updated Vital Signs BP 140/65 (BP Location: Right Arm)   Pulse 89   Temp 98.9 F (37.2 C) (Oral)   Resp 16   Ht 5' 3"  (1.6 m)   Wt 197 lb (89.4 kg)   SpO2 99%   BMI 34.90 kg/m   Visual Acuity Right Eye Distance:   Left Eye Distance:   Bilateral Distance:    Right Eye Near:   Left Eye Near:    Bilateral Near:     Physical Exam  Constitutional: He is oriented to person, place, and time. He appears well-developed and well-nourished. No distress.  HENT:  Mouth/Throat: No oropharyngeal exudate.  Bilateral TMs are normal. Oropharynx with light erythema and posterior pharynx with minor erythema and clear PND.  Neck: Normal range of motion. Neck supple.  Cardiovascular: Normal rate and regular rhythm.   Pulmonary/Chest: Effort normal and breath sounds normal. No respiratory distress. He has no wheezes. He has no rales.  Musculoskeletal:  Normal range of motion. He exhibits no edema.  Lymphadenopathy:    He has no cervical adenopathy.  Neurological: He is alert and oriented to person, place, and time.  Skin: Skin is warm and dry. No rash noted.  Psychiatric: He has a normal mood and affect.  Nursing note and vitals reviewed.    UC Treatments / Results  Labs (all labs ordered are listed, but only abnormal results are displayed) Labs Reviewed - No data to display  EKG  EKG Interpretation None       Radiology No results found.  Procedures Procedures (including critical care time)  Medications Ordered in UC Medications - No data to display   Initial Impression / Assessment and Plan / UC Course  I have reviewed the triage vital signs and the nursing notes.  Pertinent labs & imaging results that were available during my care of the patient were reviewed by me and considered in my medical decision making (see chart  for details).     The drainage in the back of your throat is likely the reason you are having a cough. This can be seen on examination. This is likely due to allergies. Take Allegra or Zyrtec on a daily basis to help with drainage. It is worse at night and this medication is not working you may take Chlor-Trimeton 2 or 4 mg every 4 hours or just at night. This is a little stronger than the nondrowsy formulas. Drink plenty of water to stay hydrated and to clear the drainage of her throat. Tylenol for discomfort.   Final Clinical Impressions(s) / UC Diagnoses   Final diagnoses:  None    New Prescriptions New Prescriptions   No medications on file     Controlled Substance Prescriptions [place] Controlled Substance Registry consulted? Not Applicable   Janne Napoleon, NP 04/12/17 1101

## 2017-04-12 NOTE — ED Triage Notes (Signed)
Pt  Reports    Symptoms  Of  Cough  /  Congested    Pain in  Chest    After     he   Coughs   Symptoms   X   sev  Days     Pt    Does  Not  Smoke  Or  Drink  He  Is   Sitting  Upright on  Exam table  In no  Distress

## 2017-04-12 NOTE — Discharge Instructions (Signed)
The drainage in the back of your throat is likely the reason you are having a cough. This can be seen on examination. This is likely due to allergies. Take Allegra or Zyrtec on a daily basis to help with drainage. It is worse at night and this medication is not working you may take Chlor-Trimeton 2 or 4 mg every 4 hours or just at night. This is a little stronger than the nondrowsy formulas. Drink plenty of water to stay hydrated and to clear the drainage of her throat. Tylenol for discomfort.

## 2017-04-14 ENCOUNTER — Other Ambulatory Visit: Payer: Self-pay

## 2017-04-14 ENCOUNTER — Emergency Department (HOSPITAL_COMMUNITY): Payer: BLUE CROSS/BLUE SHIELD

## 2017-04-14 ENCOUNTER — Encounter (HOSPITAL_COMMUNITY): Payer: Self-pay | Admitting: Emergency Medicine

## 2017-04-14 ENCOUNTER — Emergency Department (HOSPITAL_COMMUNITY)
Admission: EM | Admit: 2017-04-14 | Discharge: 2017-04-14 | Disposition: A | Discharge status: Home / Self Care | Payer: BLUE CROSS/BLUE SHIELD | Attending: Emergency Medicine | Admitting: Emergency Medicine

## 2017-04-14 DIAGNOSIS — J209 Acute bronchitis, unspecified: Secondary | ICD-10-CM | POA: Insufficient documentation

## 2017-04-14 DIAGNOSIS — R05 Cough: Secondary | ICD-10-CM | POA: Diagnosis present

## 2017-04-14 DIAGNOSIS — E876 Hypokalemia: Secondary | ICD-10-CM | POA: Diagnosis not present

## 2017-04-14 DIAGNOSIS — Z79899 Other long term (current) drug therapy: Secondary | ICD-10-CM | POA: Insufficient documentation

## 2017-04-14 DIAGNOSIS — R739 Hyperglycemia, unspecified: Secondary | ICD-10-CM | POA: Insufficient documentation

## 2017-04-14 LAB — CBC
HCT: 41.3 % (ref 39.0–52.0)
Hemoglobin: 14.8 g/dL (ref 13.0–17.0)
MCH: 28.4 pg (ref 26.0–34.0)
MCHC: 35.8 g/dL (ref 30.0–36.0)
MCV: 79.3 fL (ref 78.0–100.0)
PLATELETS: 240 10*3/uL (ref 150–400)
RBC: 5.21 MIL/uL (ref 4.22–5.81)
RDW: 12.3 % (ref 11.5–15.5)
WBC: 8.5 10*3/uL (ref 4.0–10.5)

## 2017-04-14 LAB — BASIC METABOLIC PANEL
ANION GAP: 12 (ref 5–15)
BUN: 6 mg/dL (ref 6–20)
CALCIUM: 9.3 mg/dL (ref 8.9–10.3)
CO2: 23 mmol/L (ref 22–32)
CREATININE: 0.94 mg/dL (ref 0.61–1.24)
Chloride: 101 mmol/L (ref 101–111)
GFR calc Af Amer: >60 mL/min (ref >60)
GLUCOSE: 183 mg/dL — AB (ref 65–99)
Potassium: 2.8 mmol/L — ABNORMAL LOW (ref 3.5–5.1)
Sodium: 136 mmol/L (ref 135–145)

## 2017-04-14 LAB — I-STAT CG4 LACTIC ACID, ED: LACTIC ACID, VENOUS: 2.62 mmol/L — AB (ref 0.5–1.9)

## 2017-04-14 LAB — I-STAT TROPONIN, ED: TROPONIN I, POC: 0 ng/mL (ref 0.00–0.08)

## 2017-04-14 MED ORDER — POTASSIUM CHLORIDE CRYS ER 20 MEQ PO TBCR: 40.0000 meq | EXTENDED_RELEASE_TABLET | Freq: Once | ORAL | Status: DC

## 2017-04-14 MED ORDER — POTASSIUM CHLORIDE CRYS ER 20 MEQ PO TBCR
60.0000 meq | EXTENDED_RELEASE_TABLET | Freq: Once | ORAL | Status: AC
  Administered 2017-04-14: 60 meq via ORAL
  Filled 2017-04-14: qty 3

## 2017-04-14 MED ORDER — ALBUTEROL SULFATE HFA 108 (90 BASE) MCG/ACT IN AERS
2.0000 | INHALATION_SPRAY | RESPIRATORY_TRACT | Status: DC | PRN
  Administered 2017-04-14: 2 via RESPIRATORY_TRACT
  Filled 2017-04-14: qty 6.7

## 2017-04-14 MED ORDER — AEROCHAMBER PLUS W/MASK MISC
1.0000 | Freq: Once | Status: AC
  Administered 2017-04-14: 1
  Filled 2017-04-14: qty 1

## 2017-04-14 NOTE — ED Notes (Signed)
Declined W/C at D/C and was escorted to lobby by RN. 

## 2017-04-14 NOTE — ED Triage Notes (Signed)
Pt to ER for evaluation of generalized chest pain x4 days with shortness of breath and productive cough, chest pain worse with deep breath and inspiration. Reports fevers at home, has been taking tylenol, last dose at 7 am, temp at present 98.6. Pt tachypneic at triage, no wheezing noted. Pt is a/o x4.

## 2017-04-14 NOTE — Discharge Instructions (Signed)
Use your inhaler 2 puffs every 4 hours as needed for cough or shortness of breath. Return if needed more than every 4 hours Call your primary care physician to recheck your blood potassium next week in his office.. Tell your primary care provider your blood sugar was mildly elevated at 183. You should be checked for diabetes.

## 2017-04-14 NOTE — ED Provider Notes (Signed)
[facility] DEPT Provider Note   CSN: [id number] Arrival date & time: 04/14/17  1055     History   Chief Complaint Chief Complaint  Patient presents with  . Shortness of Breath  . Chest Pain    HPI Duane Heath is a 46 y.o. male.Complains of cough productive of yellow sputum for 3-4 days. No known fever. No treatment prior to coming here. Also complains of anterior chest pain when he coughs. Seen at urgent care center 2 days ago. Recommend that he take Zyrtec or Allegra or Chlor-Trimeton. He's taken Zyrtec , without relief. No other associated symptoms. No vomiting.  HPI  History reviewed. No pertinent past medical history.  There are no active problems to display for this patient.   Past Surgical History:  Procedure Laterality Date  . APPENDECTOMY         Home Medications    Prior to Admission medications   Medication Sig Start Date End Date Taking? Authorizing Provider  cetirizine (ZYRTEC) 10 MG tablet Take 10 mg by mouth daily.   Yes [provider]  hydrochlorothiazide (HYDRODIURIL) 25 MG tablet Take 1 tablet (25 mg total) by mouth daily. Take on tablet in the morning. 03/12/17  Yes Clent Demark, PA-C    Family History History reviewed. No pertinent family history.  Social History Social History  Substance Use Topics  . Smoking status: Never Smoker  . Smokeless tobacco: Never Used  . Alcohol use No     Allergies   Patient has no known allergies.   Review of Systems Review of Systems  Respiratory: Positive for cough and shortness of breath.   Cardiovascular: Positive for chest pain.  All other systems reviewed and are negative.    Physical Exam Updated Vital Signs BP 117/87   Pulse 84   Temp 98.4 F (36.9 C) (Oral)   Resp 20   SpO2 97%   Physical Exam  Constitutional: He appears well-developed and well-nourished. No distress.  HENT:  Head: Normocephalic and atraumatic.  Eyes: Pupils are equal, round, and reactive to  light. Conjunctivae are normal.  Neck: Neck supple. No tracheal deviation present. No thyromegaly present.  Cardiovascular: Normal rate and regular rhythm.   No murmur heard. Pulmonary/Chest: Effort normal and breath sounds normal.  Coughing frequently  Abdominal: Soft. Bowel sounds are normal. He exhibits no distension. There is no tenderness.  Musculoskeletal: Normal range of motion. He exhibits no edema or tenderness.  Neurological: He is alert. Coordination normal.  Skin: Skin is warm and dry. No rash noted.  Psychiatric: He has a normal mood and affect.  Nursing note and vitals reviewed.    ED Treatments / Results  Labs (all labs ordered are listed, but only abnormal results are displayed) Labs Reviewed  BASIC METABOLIC PANEL - Abnormal; Notable for the following:       Result Value   Potassium 2.8 (*)    Glucose, Bld 183 (*)    All other components within normal limits  I-STAT CG4 LACTIC ACID, ED - Abnormal; Notable for the following:    Lactic Acid, Venous 2.62 (*)    All other components within normal limits  CULTURE, BLOOD (ROUTINE X 2)  CULTURE, BLOOD (ROUTINE X 2)  CBC  I-STAT TROPONIN, ED    EKG  EKG Interpretation  Date/Time:  Saturday April 14 2017 11:06:07 EDT Ventricular Rate:  91 PR Interval:  162 QRS Duration: 76 QT Interval:  340 QTC Calculation: 418 R Axis:   21 Text Interpretation:  Normal sinus rhythm Cannot rule out Anterior infarct , age undetermined Abnormal ECG No significant change since last tracing Confirmed by Orlie Dakin [id number]) on 04/14/2017 12:02:37 PM      Results for orders placed or performed during the hospital encounter of 86/76/72  Basic metabolic panel  Result Value Ref Range   Sodium 136 135 - 145 mmol/L   Potassium 2.8 (L) 3.5 - 5.1 mmol/L   Chloride 101 101 - 111 mmol/L   CO2 23 22 - 32 mmol/L   Glucose, Bld 183 (H) 65 - 99 mg/dL   BUN 6 6 - 20 mg/dL   Creatinine, Ser 0.94 0.61 - 1.24 mg/dL   Calcium 9.3 8.9 -  10.3 mg/dL   GFR calc non Af Amer >60 >60 mL/min   GFR calc Af Amer >60 >60 mL/min   Anion gap 12 5 - 15  CBC  Result Value Ref Range   WBC 8.5 4.0 - 10.5 K/uL   RBC 5.21 4.22 - 5.81 MIL/uL   Hemoglobin 14.8 13.0 - 17.0 g/dL   HCT 41.3 39.0 - 52.0 %   MCV 79.3 78.0 - 100.0 fL   MCH 28.4 26.0 - 34.0 pg   MCHC 35.8 30.0 - 36.0 g/dL   RDW 12.3 11.5 - 15.5 %   Platelets 240 150 - 400 K/uL  I-stat troponin, ED  Result Value Ref Range   Troponin i, poc 0.00 0.00 - 0.08 ng/mL   Comment 3          I-Stat CG4 Lactic Acid, ED  Result Value Ref Range   Lactic Acid, Venous 2.62 (HH) 0.5 - 1.9 mmol/L   Comment NOTIFIED PHYSICIAN    Dg Chest 2 View  Result Date: 04/14/2017 CLINICAL DATA:  Productive cough with generalized chest pain shortness of breath with fever for days. EXAM: CHEST  2 VIEW COMPARISON:  01/18/2017 FINDINGS: Lungs are hypoinflated without focal consolidation or effusion. Cardiomediastinal silhouette and remainder of the chest radiograph is unchanged. IMPRESSION: Hypoinflation without acute cardiopulmonary disease. Electronically Signed   By: Marin Olp M.D.   On: 04/14/2017 11:35   Radiology Dg Chest 2 View  Result Date: 04/14/2017 CLINICAL DATA:  Productive cough with generalized chest pain shortness of breath with fever for days. EXAM: CHEST  2 VIEW COMPARISON:  01/18/2017 FINDINGS: Lungs are hypoinflated without focal consolidation or effusion. Cardiomediastinal silhouette and remainder of the chest radiograph is unchanged. IMPRESSION: Hypoinflation without acute cardiopulmonary disease. Electronically Signed   By: Marin Olp M.D.   On: 04/14/2017 11:35    Procedures Procedures (including critical care time)  Medications Ordered in ED Medications  albuterol (PROVENTIL HFA;VENTOLIN HFA) 108 (90 Base) MCG/ACT inhaler 2 puff (2 puffs Inhalation Given 04/14/17 1259)  potassium chloride SA (K-DUR,KLOR-CON) CR tablet 60 mEq (not administered)  aerochamber plus with mask  device 1 each (1 each Other Given 04/14/17 1259)   Chest x-ray viewed by me  Initial Impression / Assessment and Plan / ED Course  I have reviewed the triage vital signs and the nursing notes.  Pertinent labs & imaging results that were available during my care of the patient were reviewed by me and considered in my medical decision making (see chart for details).    Physical after treatment with albuterol inhaler with spacer. Feels, Ready to go home Of note patient'primary language is [place]. I offered profession medical interpreter however none was available. He did feel that we understood each other adequately and was okay with not using interpreter.  Seek oral potassium supplementation prior to discharge and albuterol HFA with spacer to go to use 2 puffs every 4 hours as needed for cough or shortness of breath  Final Clinical Impressions(s) / ED Diagnoses  Diagnosis #1 acute bronchitis  #2 hyperglycemia  #3 hypokalemia  Final diagnoses:  None    New Prescriptions New Prescriptions   No medications on file     Orlie Dakin, MD 04/14/17 1340

## 2017-04-14 NOTE — ED Notes (Signed)
Lab at the bedside and blood cultures x 2 collected

## 2017-04-26 ENCOUNTER — Ambulatory Visit (INDEPENDENT_AMBULATORY_CARE_PROVIDER_SITE_OTHER): Payer: BLUE CROSS/BLUE SHIELD | Admitting: Physician Assistant

## 2017-04-26 ENCOUNTER — Encounter (INDEPENDENT_AMBULATORY_CARE_PROVIDER_SITE_OTHER): Payer: Self-pay | Admitting: Physician Assistant

## 2017-04-26 VITALS — BP 117/80 | HR 95 | Temp 98.5°F | Wt 196.2 lb

## 2017-04-26 DIAGNOSIS — R059 Cough, unspecified: Secondary | ICD-10-CM

## 2017-04-26 DIAGNOSIS — R05 Cough: Secondary | ICD-10-CM

## 2017-04-26 MED ORDER — PREDNISONE 20 MG PO TABS: 40.0000 mg | ORAL_TABLET | Freq: Every day | ORAL | 0 refills | Status: DC

## 2017-04-26 MED ORDER — HYDROCOD POLST-CPM POLST ER 10-8 MG/5ML PO SUER: 5.0000 mL | Freq: Two times a day (BID) | ORAL | 0 refills | Status: DC

## 2017-04-26 NOTE — Patient Instructions (Signed)
Cough, Adult Coughing is a reflex that clears your throat and your airways. Coughing helps to heal and protect your lungs. It is normal to cough occasionally, but a cough that happens with other symptoms or lasts a long time may be a sign of a condition that needs treatment. A cough may last only 2-3 weeks (acute), or it may last longer than 8 weeks (chronic). What are the causes? Coughing is commonly caused by:  Breathing in substances that irritate your lungs.  A viral or bacterial respiratory infection.  Allergies.  Asthma.  Postnasal drip.  Smoking.  Acid backing up from the stomach into the esophagus (gastroesophageal reflux).  Certain medicines.  Chronic lung problems, including COPD (or rarely, lung cancer).  Other medical conditions such as heart failure.  Follow these instructions at home: Pay attention to any changes in your symptoms. Take these actions to help with your discomfort:  Take medicines only as told by your health care provider. ? If you were prescribed an antibiotic medicine, take it as told by your health care provider. Do not stop taking the antibiotic even if you start to feel better. ? Talk with your health care provider before you take a cough suppressant medicine.  Drink enough fluid to keep your urine clear or pale yellow.  If the air is dry, use a cold steam vaporizer or humidifier in your bedroom or your home to help loosen secretions.  Avoid anything that causes you to cough at work or at home.  If your cough is worse at night, try sleeping in a semi-upright position.  Avoid cigarette smoke. If you smoke, quit smoking. If you need help quitting, ask your health care provider.  Avoid caffeine.  Avoid alcohol.  Rest as needed.  Contact a health care provider if:  You have new symptoms.  You cough up pus.  Your cough does not get better after 2-3 weeks, or your cough gets worse.  You cannot control your cough with suppressant  medicines and you are losing sleep.  You develop pain that is getting worse or pain that is not controlled with pain medicines.  You have a fever.  You have unexplained weight loss.  You have night sweats. Get help right away if:  You cough up blood.  You have difficulty breathing.  Your heartbeat is very fast. This information is not intended to replace advice given to you by your health care provider. Make sure you discuss any questions you have with your health care provider. Document Released: 02/17/2011 Document Revised: 01/27/2016 Document Reviewed: 10/28/2014 Elsevier Interactive Patient Education  2017 Elsevier Inc.  

## 2017-04-26 NOTE — Progress Notes (Signed)
Subjective:  Patient ID: Duane Heath, male    DOB: 05-11-1971  Age: 46 y.o. MRN: [id number]  CC: Hospital f/u bronchitis  HPI Kaushik Maul is a 46 y.o. male with a medical history of HTN presents on ED visit f/u on 04/14/17. Diagnosed with bronchitis. Not prescribed anything but is taking Cetirizine daily. Says cough is somewhat better since leaving the ED. However, feels he has something stuck in the throat. Sensation is constant all day. Still has cough that is predominant during the night. Mild cough throughout the day. Does not think cetirizine is helping. No other associated symptoms or complaints.         Outpatient Medications Prior to Visit  Medication Sig Dispense Refill  . cetirizine (ZYRTEC) 10 MG tablet Take 10 mg by mouth daily.    . hydrochlorothiazide (HYDRODIURIL) 25 MG tablet Take 1 tablet (25 mg total) by mouth daily. Take on tablet in the morning. 90 tablet 1   No facility-administered medications prior to visit.      ROS Review of Systems  Constitutional: Negative for chills, fever and malaise/fatigue.  Eyes: Negative for blurred vision.  Respiratory: Positive for cough. Negative for shortness of breath.   Cardiovascular: Negative for chest pain and palpitations.  Gastrointestinal: Negative for abdominal pain and nausea.  Genitourinary: Negative for dysuria and hematuria.  Musculoskeletal: Negative for joint pain and myalgias.  Skin: Negative for rash.  Neurological: Negative for tingling and headaches.  Psychiatric/Behavioral: Negative for depression. The patient is not nervous/anxious.     Objective:  BP 117/80 (BP Location: Left Arm, Patient Position: Sitting, Cuff Size: Normal)   Pulse 95   Temp 98.5 F (36.9 C) (Oral)   Wt 196 lb 3.2 oz (89 kg)   SpO2 97%   BMI 34.76 kg/m   BP/Weight 04/26/2017 12/11/8117 09/08/7827  Systolic BP 562 130 865  Diastolic BP 80 83 65  Wt. (Lbs) 196.2 - 197  BMI 34.76 - 34.9      Physical Exam  Constitutional: He  is oriented to person, place, and time.  Well developed, well nourished, NAD, polite  HENT:  Head: Normocephalic and atraumatic.  Eyes: No scleral icterus.  Neck: Normal range of motion. Neck supple. No thyromegaly present.  Cardiovascular: Normal rate, regular rhythm and normal heart sounds.   Pulmonary/Chest: Effort normal and breath sounds normal. No respiratory distress. He has no wheezes. He has no rales.  Abdominal: Soft. Bowel sounds are normal. There is no tenderness.  Musculoskeletal: He exhibits no edema.  Neurological: He is alert and oriented to person, place, and time. No cranial nerve deficit. Coordination normal.  Skin: Skin is warm and dry. No rash noted. No erythema. No pallor.  Psychiatric: He has a normal mood and affect. His behavior is normal. Thought content normal.  Vitals reviewed.    Assessment & Plan:    1. Cough - Begin predniSONE (DELTASONE) 20 MG tablet; Take 2 tablets (40 mg total) by mouth daily with breakfast.  Dispense: 14 tablet; Refill: 0 - Begin chlorpheniramine-HYDROcodone (TUSSIONEX PENNKINETIC ER) 10-8 MG/5ML SUER; Take 5 mLs by mouth 2 (two) times daily.  Dispense: 140 mL; Refill: 0       Meds ordered this encounter  Medications  . predniSONE (DELTASONE) 20 MG tablet    Sig: Take 2 tablets (40 mg total) by mouth daily with breakfast.    Dispense:  14 tablet    Refill:  0    Order Specific Question:   Supervising Provider  AnswerTresa Garter [id number]  . chlorpheniramine-HYDROcodone (TUSSIONEX PENNKINETIC ER) 10-8 MG/5ML SUER    Sig: Take 5 mLs by mouth 2 (two) times daily.    Dispense:  140 mL    Refill:  0    Order Specific Question:   Supervising Provider    Answer:   Tresa Garter [[id number]]    Follow-up: Return in about 8 weeks (around 06/21/2017), or if symptoms worsen or fail to improve, for full physical.   [name]

## 2017-04-26 NOTE — Progress Notes (Signed)
Coughing a lot, feels as tough something is stuck in his throat

## 2017-08-30 ENCOUNTER — Encounter (INDEPENDENT_AMBULATORY_CARE_PROVIDER_SITE_OTHER): Payer: Self-pay | Admitting: Physician Assistant

## 2017-08-30 ENCOUNTER — Ambulatory Visit (INDEPENDENT_AMBULATORY_CARE_PROVIDER_SITE_OTHER): Payer: BLUE CROSS/BLUE SHIELD | Admitting: Physician Assistant

## 2017-08-30 ENCOUNTER — Other Ambulatory Visit: Payer: Self-pay

## 2017-08-30 VITALS — BP 128/85 | HR 85 | Temp 98.1°F | Wt 191.6 lb

## 2017-08-30 DIAGNOSIS — I10 Essential (primary) hypertension: Secondary | ICD-10-CM | POA: Diagnosis not present

## 2017-08-30 DIAGNOSIS — R42 Dizziness and giddiness: Secondary | ICD-10-CM

## 2017-08-30 DIAGNOSIS — Z23 Encounter for immunization: Secondary | ICD-10-CM

## 2017-08-30 MED ORDER — HYDROCHLOROTHIAZIDE 12.5 MG PO TABS: 12.5000 mg | ORAL_TABLET | Freq: Every day | ORAL | 3 refills | Status: DC

## 2017-08-30 NOTE — Progress Notes (Signed)
Subjective:  Patient ID: Duane Heath, male    DOB: 03-Apr-1971  Age: 46 y.o. MRN: [id number]  CC: f/u HTN  HPI  Duane Heath is a 46 y.o. male with a medical history of HTN presents to f/u on HTN. Says he has been feeling dizziness over the last two months. Happens as he is standing from a seated or supine position. Drinks three eight oz bottles of water per day. Does not endorse CP, palpitations, SOB, HA, abdominal pain, f/c/n/v, rash, swelling, or GI/GU sxs.    Outpatient Medications Prior to Visit  Medication Sig Dispense Refill  . hydrochlorothiazide (HYDRODIURIL) 25 MG tablet Take 1 tablet (25 mg total) by mouth daily. Take on tablet in the morning. 90 tablet 1  . cetirizine (ZYRTEC) 10 MG tablet Take 10 mg by mouth daily.    . chlorpheniramine-HYDROcodone (TUSSIONEX PENNKINETIC ER) 10-8 MG/5ML SUER Take 5 mLs by mouth 2 (two) times daily. (Patient not taking: Reported on 08/30/2017) 140 mL 0  . predniSONE (DELTASONE) 20 MG tablet Take 2 tablets (40 mg total) by mouth daily with breakfast. (Patient not taking: Reported on 08/30/2017) 14 tablet 0   No facility-administered medications prior to visit.      ROS Review of Systems  Constitutional: Negative for chills, fever and malaise/fatigue.  Eyes: Negative for blurred vision.  Respiratory: Negative for shortness of breath.   Cardiovascular: Negative for chest pain, palpitations and leg swelling.  Gastrointestinal: Negative for abdominal pain and nausea.  Genitourinary: Negative for dysuria and hematuria.  Musculoskeletal: Negative for joint pain and myalgias.  Skin: Negative for rash.  Neurological: Positive for dizziness. Negative for tingling and headaches.  Psychiatric/Behavioral: Negative for depression. The patient is not nervous/anxious.     Objective:  BP 128/85 (BP Location: Left Arm, Patient Position: Sitting, Cuff Size: Normal)   Pulse 85   Temp 98.1 F (36.7 C) (Oral)   Wt 191 lb 9.6 oz (86.9 kg)   SpO2 96%    BMI 33.94 kg/m   BP/Weight 08/30/2017 04/26/2017 0/04/6760  Systolic BP 950 932 671  Diastolic BP 85 80 83  Wt. (Lbs) 191.6 196.2 -  BMI 33.94 34.76 -      Physical Exam  Constitutional: He is oriented to person, place, and time.  Well developed, well nourished, NAD, polite  HENT:  Head: Normocephalic and atraumatic.  Eyes: No scleral icterus.  Neck: Normal range of motion. Neck supple. No thyromegaly present.  Cardiovascular: Normal rate, regular rhythm and normal heart sounds. Exam reveals no gallop and no friction rub.  No murmur heard. No carotid bruit bilaterally  Pulmonary/Chest: Effort normal and breath sounds normal. No respiratory distress. He has no wheezes.  Musculoskeletal: He exhibits no edema.  Neurological: He is alert and oriented to person, place, and time. No cranial nerve deficit. Coordination normal.  Skin: Skin is warm and dry. No rash noted. No erythema. No pallor.  Psychiatric: He has a normal mood and affect. His behavior is normal. Thought content normal.  Vitals reviewed.    Assessment & Plan:    1. Hypertension, unspecified type - Comprehensive metabolic panel; Future - Lipid panel; Future - Stop HCTZ 25 mg - Begin HCTZ 12.5 mg  2. Lightheadedness - Patient not hydrating properly. I have advised 8-10 cups of water per day. HCTZ has also been reduced to 12.5 mg from 25 mg.   3. Need for prophylactic vaccination and inoculation against influenza - Administered Flu Vaccine QUAD 6+ mos PF IM (Fluarix Quad PF)  Meds ordered this encounter  Medications  . hydrochlorothiazide (HYDRODIURIL) 12.5 MG tablet    Sig: Take 1 tablet (12.5 mg total) by mouth daily.    Dispense:  90 tablet    Refill:  3    Order Specific Question:   Supervising Provider    Answer:   Tresa Garter [id number]    Follow-up: No Follow-up on file.   [name]

## 2017-08-30 NOTE — Patient Instructions (Signed)
Orthostatic Hypotension Orthostatic hypotension is a sudden drop in blood pressure that happens when you quickly change positions, such as when you get up from a seated or lying position. Blood pressure is a measurement of how strongly, or weakly, your blood is pressing against the walls of your arteries. Arteries are blood vessels that carry blood from your heart throughout your body. When blood pressure is too low, you may not get enough blood to your brain or to the rest of your organs. This can cause weakness, light-headedness, rapid heartbeat, and fainting. This can last for just a few seconds or for up to a few minutes. Orthostatic hypotension is usually not a serious problem. However, if it happens frequently or gets worse, it may be a sign of something more serious. What are the causes? This condition may be caused by:  Sudden changes in posture, such as standing up quickly after you have been sitting or lying down.  Blood loss.  Loss of body fluids (dehydration).  Heart problems.  Hormone (endocrine) problems.  Pregnancy.  Severe infection.  Lack of certain nutrients.  Severe allergic reactions (anaphylaxis).  Certain medicines, such as blood pressure medicine or medicines that make the body lose excess fluids (diuretics). Sometimes, this condition can be caused by not taking medicine as directed, such as taking too much of a certain medicine.  What increases the risk? Certain factors can make you more likely to develop orthostatic hypotension, including:  Age. Risk increases as you get older.  Conditions that affect the heart or the central nervous system.  Taking certain medicines, such as blood pressure medicine or diuretics.  Being pregnant.  What are the signs or symptoms? Symptoms of this condition may include:  Weakness.  Light-headedness.  Dizziness.  Blurred vision.  Fatigue.  Rapid heartbeat.  Fainting, in severe cases.  How is this  diagnosed? This condition is diagnosed based on:  Your medical history.  Your symptoms.  Your blood pressure measurement. Your health care provider will check your blood pressure when you are: ? Lying down. ? Sitting. ? Standing.  A blood pressure reading is recorded as two numbers, such as "120 over 80" (or 120/80). The first ("top") number is called the systolic pressure. It is a measure of the pressure in your arteries as your heart beats. The second ("bottom") number is called the diastolic pressure. It is a measure of the pressure in your arteries when your heart relaxes between beats. Blood pressure is measured in a unit called mm Hg. Healthy blood pressure for adults is 120/80. If your blood pressure is below 90/60, you may be diagnosed with hypotension. Other information or tests that may be used to diagnose orthostatic hypotension include:  Your other vital signs, such as your heart rate and temperature.  Blood tests.  Tilt table test. For this test, you will be safely secured to a table that moves you from a lying position to an upright position. Your heart rhythm and blood pressure will be monitored during the test.  How is this treated? Treatment for this condition may include:  Changing your diet. This may involve eating more salt (sodium) or drinking more water.  Taking medicines to raise your blood pressure.  Changing the dosage of certain medicines you are taking that might be lowering your blood pressure.  Wearing compression stockings. These stockings help to prevent blood clots and reduce swelling in your legs.  In some cases, you may need to go to the hospital for:    Fluid replacement. This means you will receive fluids through an IV tube.  Blood replacement. This means you will receive donated blood through an IV tube (transfusion).  Treating an infection or heart problems, if this applies.  Monitoring. You may need to be monitored while medicines that you  are taking wear off.  Follow these instructions at home: Eating and drinking   Drink enough fluid to keep your urine clear or pale yellow.  Eat a healthy diet and follow instructions from your health care provider about eating or drinking restrictions. A healthy diet includes: ? Fresh fruits and vegetables. ? Whole grains. ? Lean meats. ? Low-fat dairy products.  Eat extra salt only as directed. Do not add extra salt to your diet unless your health care provider told you to do that.  Eat frequent, small meals.  Avoid standing up suddenly after eating. Medicines  Take over-the-counter and prescription medicines only as told by your health care provider. ? Follow instructions from your health care provider about changing the dosage of your current medicines, if this applies. ? Do not stop or adjust any of your medicines on your own. General instructions  Wear compression stockings as told by your health care provider.  Get up slowly from lying down or sitting positions. This gives your blood pressure a chance to adjust.  Avoid hot showers and excessive heat as directed by your health care provider.  Return to your normal activities as told by your health care provider. Ask your health care provider what activities are safe for you.  Do not use any products that contain nicotine or tobacco, such as cigarettes and e-cigarettes. If you need help quitting, ask your health care provider.  Keep all follow-up visits as told by your health care provider. This is important. Contact a health care provider if:  You vomit.  You have diarrhea.  You have a fever for more than 2-3 days.  You feel more thirsty than usual.  You feel weak and tired. Get help right away if:  You have chest pain.  You have a fast or irregular heartbeat.  You develop numbness in any part of your body.  You cannot move your arms or your legs.  You have trouble speaking.  You become sweaty or feel  lightheaded.  You faint.  You feel short of breath.  You have trouble staying awake.  You feel confused. This information is not intended to replace advice given to you by your health care provider. Make sure you discuss any questions you have with your health care provider. Document Released: 08/11/2002 Document Revised: 05/09/2016 Document Reviewed: 02/11/2016 Elsevier Interactive Patient Education  2018 Elsevier Inc.  

## 2017-09-17 ENCOUNTER — Other Ambulatory Visit (INDEPENDENT_AMBULATORY_CARE_PROVIDER_SITE_OTHER): Payer: BLUE CROSS/BLUE SHIELD

## 2017-09-17 DIAGNOSIS — I10 Essential (primary) hypertension: Secondary | ICD-10-CM

## 2017-09-18 LAB — COMPREHENSIVE METABOLIC PANEL
A/G RATIO: 1.4 (ref 1.2–2.2)
ALT: 41 IU/L (ref 0–44)
AST: 36 IU/L (ref 0–40)
Albumin: 4.7 g/dL (ref 3.5–5.5)
Alkaline Phosphatase: 68 IU/L (ref 39–117)
BUN/Creatinine Ratio: 11 (ref 9–20)
BUN: 11 mg/dL (ref 6–24)
Bilirubin Total: 0.6 mg/dL (ref 0.0–1.2)
CALCIUM: 9.6 mg/dL (ref 8.7–10.2)
CO2: 26 mmol/L (ref 20–29)
CREATININE: 0.96 mg/dL (ref 0.76–1.27)
Chloride: 102 mmol/L (ref 96–106)
GFR, EST AFRICAN AMERICAN: 110 mL/min/{1.73_m2} (ref >59)
GFR, EST NON AFRICAN AMERICAN: 95 mL/min/{1.73_m2} (ref >59)
GLOBULIN, TOTAL: 3.3 g/dL (ref 1.5–4.5)
Glucose: 99 mg/dL (ref 65–99)
POTASSIUM: 3.7 mmol/L (ref 3.5–5.2)
SODIUM: 143 mmol/L (ref 134–144)
TOTAL PROTEIN: 8 g/dL (ref 6.0–8.5)

## 2017-09-18 LAB — LIPID PANEL
CHOL/HDL RATIO: 4.1 ratio (ref 0.0–5.0)
Cholesterol, Total: 173 mg/dL (ref 100–199)
HDL: 42 mg/dL (ref >39)
LDL CALC: 108 mg/dL — AB (ref 0–99)
Triglycerides: 116 mg/dL (ref 0–149)
VLDL Cholesterol Cal: 23 mg/dL (ref 5–40)

## 2017-09-19 ENCOUNTER — Telehealth (INDEPENDENT_AMBULATORY_CARE_PROVIDER_SITE_OTHER): Payer: Self-pay

## 2017-09-19 NOTE — Telephone Encounter (Signed)
-----   Message from Clent Demark, PA-C sent at 09/18/2017  1:50 PM EST ----- Normal results except for slightly elevated LDL. Please watch intake of fried/fatty foods.

## 2017-09-19 NOTE — Telephone Encounter (Signed)
Patient aware of normal lab except slightly elevated LDL is aware to stay away from fried/fatty foods. Nat Christen, CMA

## 2018-02-26 ENCOUNTER — Ambulatory Visit (INDEPENDENT_AMBULATORY_CARE_PROVIDER_SITE_OTHER): Payer: BLUE CROSS/BLUE SHIELD | Admitting: Physician Assistant

## 2018-02-26 ENCOUNTER — Encounter (INDEPENDENT_AMBULATORY_CARE_PROVIDER_SITE_OTHER): Payer: Self-pay | Admitting: Physician Assistant

## 2018-02-26 ENCOUNTER — Other Ambulatory Visit: Payer: Self-pay

## 2018-02-26 VITALS — BP 130/86 | HR 85 | Temp 98.1°F | Ht 67.5 in | Wt 192.6 lb

## 2018-02-26 DIAGNOSIS — R202 Paresthesia of skin: Secondary | ICD-10-CM | POA: Diagnosis not present

## 2018-02-26 DIAGNOSIS — M79601 Pain in right arm: Secondary | ICD-10-CM | POA: Diagnosis not present

## 2018-02-26 DIAGNOSIS — Z131 Encounter for screening for diabetes mellitus: Secondary | ICD-10-CM

## 2018-02-26 DIAGNOSIS — M79602 Pain in left arm: Secondary | ICD-10-CM | POA: Diagnosis not present

## 2018-02-26 LAB — POCT GLYCOSYLATED HEMOGLOBIN (HGB A1C): HEMOGLOBIN A1C: 5.6 % (ref 4.0–5.6)

## 2018-02-26 MED ORDER — HYDROCHLOROTHIAZIDE 12.5 MG PO TABS: 12.5000 mg | ORAL_TABLET | Freq: Every day | ORAL | 3 refills | Status: AC

## 2018-02-26 MED ORDER — AMITRIPTYLINE HCL 10 MG PO TABS: 10.0000 mg | ORAL_TABLET | Freq: Every day | ORAL | 2 refills | Status: AC

## 2018-02-26 MED ORDER — AMITRIPTYLINE HCL 10 MG PO TABS: 10.0000 mg | ORAL_TABLET | Freq: Every day | ORAL | 2 refills | Status: DC

## 2018-02-26 MED ORDER — NEPHROCAPS 1 MG PO CAPS: 1.0000 | ORAL_CAPSULE | Freq: Every day | ORAL | 0 refills | Status: AC

## 2018-02-26 NOTE — Patient Instructions (Signed)
Paresthesia Paresthesia is a burning or prickling feeling. This feeling can happen in any part of the body. It often happens in the hands, arms, legs, or feet. Usually, it is not painful. In most cases, the feeling goes away in a short time and is not a sign of a serious problem. Follow these instructions at home:  Avoid drinking alcohol.  Try massage or needle therapy (acupuncture) to help with your problems.  Keep all follow-up visits as told by your doctor. This is important. Contact a doctor if:  You keep on having episodes of paresthesia.  Your burning or prickling feeling gets worse when you walk.  You have pain or cramps.  You feel dizzy.  You have a rash. Get help right away if:  You feel weak.  You have trouble walking or moving.  You have problems speaking, understanding, or seeing.  You feel confused.  You cannot control when you pee (urinate) or poop (bowel movement).  You lose feeling (numbness) after an injury.  You pass out (faint). This information is not intended to replace advice given to you by your health care provider. Make sure you discuss any questions you have with your health care provider. Document Released: 08/03/2008 Document Revised: 01/27/2016 Document Reviewed: 08/17/2014 Elsevier Interactive Patient Education  2018 Elsevier Inc.  

## 2018-02-26 NOTE — Progress Notes (Signed)
Subjective:  Patient ID: Duane Heath, male    DOB: 1971/02/20  Age: 47 y.o. MRN: [id number]  CC: burning sensation in the feet  HPI Duane Heath a 47 y.o.malewith amedical history of HTN presents with burning of the feet and tingling/numbness of the arms/hands. Has associated neck stiffness. Works as a Photographer. Has not taken anything for relief. No identifiable factors of amelioration or aggravation. Does not endorse any other neurological symptoms/deficits. Denies CP, palpitations, SOB, HA, f/c/n/v, rash, or GI/GU.     Outpatient Medications Prior to Visit  Medication Sig Dispense Refill  . hydrochlorothiazide (HYDRODIURIL) 12.5 MG tablet Take 1 tablet (12.5 mg total) by mouth daily. 90 tablet 3  . cetirizine (ZYRTEC) 10 MG tablet Take 10 mg by mouth daily.     No facility-administered medications prior to visit.      ROS Review of Systems  Constitutional: Negative for chills, fever and malaise/fatigue.  Eyes: Negative for blurred vision.  Respiratory: Negative for shortness of breath.   Cardiovascular: Negative for chest pain and palpitations.  Gastrointestinal: Negative for abdominal pain and nausea.  Genitourinary: Negative for dysuria and hematuria.  Musculoskeletal: Negative for joint pain and myalgias.  Skin: Negative for rash.  Neurological: Positive for tingling. Negative for headaches.  Psychiatric/Behavioral: Negative for depression. The patient is not nervous/anxious.     Objective:  BP 130/86 (BP Location: Left Arm, Patient Position: Sitting, Cuff Size: Normal)   Pulse 85   Temp 98.1 F (36.7 C) (Oral)   Ht 5' 7.5" (1.715 m)   Wt 192 lb 9.6 oz (87.4 kg)   SpO2 96%   BMI 29.72 kg/m   BP/Weight 02/26/2018 08/30/2017 6/83/4196  Systolic BP 222 979 892  Diastolic BP 86 85 80  Wt. (Lbs) 192.6 191.6 196.2  BMI 29.72 33.94 34.76      Physical Exam  Constitutional: He is oriented to person, place, and time.  Well developed, well nourished, NAD, polite   HENT:  Head: Normocephalic and atraumatic.  Eyes: No scleral icterus.  Neck: Normal range of motion. Neck supple. No thyromegaly present.  Cardiovascular: Normal rate, regular rhythm and normal heart sounds.  Pulmonary/Chest: Effort normal and breath sounds normal.  Abdominal: Soft. Bowel sounds are normal. There is no tenderness.  Musculoskeletal: He exhibits no edema.  Neurological: He is alert and oriented to person, place, and time.  Skin: Skin is warm and dry. No rash noted. No erythema. No pallor.  Psychiatric: He has a normal mood and affect. His behavior is normal. Thought content normal.  Vitals reviewed.    Assessment & Plan:    1. Paresthesia and pain of both upper extremities - b complex-C-folic acid 1 MG capsule; Take 1 capsule (1 mg total) by mouth daily.  Dispense: 90 capsule; Refill: 0 - Sedimentation Rate - C-reactive protein - ANA w/Reflex - CBC with Differential - Basic Metabolic Panel - amitriptyline (ELAVIL) 10 MG tablet; Take 1 tablet (10 mg total) by mouth at bedtime.  Dispense: 30 tablet; Refill: 2  2. Paresthesia of both feet - b complex-C-folic acid 1 MG capsule; Take 1 capsule (1 mg total) by mouth daily.  Dispense: 90 capsule; Refill: 0 - Sedimentation Rate - C-reactive protein - ANA w/Reflex - CBC with Differential - Basic Metabolic Panel - amitriptyline (ELAVIL) 10 MG tablet; Take 1 tablet (10 mg total) by mouth at bedtime.  Dispense: 30 tablet; Refill: 2  3. Screening for diabetes mellitus - HgB A1c 5.6% today.     Meds ordered  this encounter  Medications  . b complex-C-folic acid 1 MG capsule    Sig: Take 1 capsule (1 mg total) by mouth daily.    Dispense:  90 capsule    Refill:  0    Order Specific Question:   Supervising Provider    Answer:   Charlott Rakes [[id number]]  . DISCONTD: amitriptyline (ELAVIL) 10 MG tablet    Sig: Take 1 tablet (10 mg total) by mouth at bedtime.    Dispense:  30 tablet    Refill:  2    Order Specific  Question:   Supervising Provider    Answer:   Charlott Rakes [[id number]]  . amitriptyline (ELAVIL) 10 MG tablet    Sig: Take 1 tablet (10 mg total) by mouth at bedtime.    Dispense:  30 tablet    Refill:  2    Order Specific Question:   Supervising Provider    Answer:   Charlott Rakes [[id number]]  . hydrochlorothiazide (HYDRODIURIL) 12.5 MG tablet    Sig: Take 1 tablet (12.5 mg total) by mouth daily.    Dispense:  90 tablet    Refill:  3    Order Specific Question:   Supervising Provider    Answer:   Charlott Rakes [[id number]]    Follow-up: Return in about 1 month (around 03/26/2018) for paresthesia.   [name]

## 2018-02-27 ENCOUNTER — Other Ambulatory Visit (INDEPENDENT_AMBULATORY_CARE_PROVIDER_SITE_OTHER): Payer: Self-pay | Admitting: Physician Assistant

## 2018-02-27 DIAGNOSIS — E876 Hypokalemia: Secondary | ICD-10-CM

## 2018-02-27 LAB — BASIC METABOLIC PANEL
BUN / CREAT RATIO: 11 (ref 9–20)
BUN: 11 mg/dL (ref 6–24)
CALCIUM: 10.4 mg/dL — AB (ref 8.7–10.2)
CHLORIDE: 100 mmol/L (ref 96–106)
CO2: 26 mmol/L (ref 20–29)
Creatinine, Ser: 0.98 mg/dL (ref 0.76–1.27)
GFR, EST AFRICAN AMERICAN: 107 mL/min/{1.73_m2} (ref >59)
GFR, EST NON AFRICAN AMERICAN: 93 mL/min/{1.73_m2} (ref >59)
GLUCOSE: 78 mg/dL (ref 65–99)
POTASSIUM: 3.3 mmol/L — AB (ref 3.5–5.2)
SODIUM: 141 mmol/L (ref 134–144)

## 2018-02-27 LAB — CBC WITH DIFFERENTIAL/PLATELET
BASOS ABS: 0.1 10*3/uL (ref 0.0–0.2)
Basos: 1 %
EOS (ABSOLUTE): 0.3 10*3/uL (ref 0.0–0.4)
EOS: 3 %
HEMATOCRIT: 47.6 % (ref 37.5–51.0)
HEMOGLOBIN: 16.2 g/dL (ref 13.0–17.7)
IMMATURE GRANULOCYTES: 0 %
Immature Grans (Abs): 0 10*3/uL (ref 0.0–0.1)
Lymphocytes Absolute: 2.5 10*3/uL (ref 0.7–3.1)
Lymphs: 29 %
MCH: 28.6 pg (ref 26.6–33.0)
MCHC: 34 g/dL (ref 31.5–35.7)
MCV: 84 fL (ref 79–97)
MONOCYTES: 10 %
MONOS ABS: 0.8 10*3/uL (ref 0.1–0.9)
NEUTROS PCT: 57 %
Neutrophils Absolute: 4.8 10*3/uL (ref 1.4–7.0)
Platelets: 294 10*3/uL (ref 150–450)
RBC: 5.67 x10E6/uL (ref 4.14–5.80)
RDW: 13.1 % (ref 12.3–15.4)
WBC: 8.5 10*3/uL (ref 3.4–10.8)

## 2018-02-27 LAB — ANA W/REFLEX: Anti Nuclear Antibody(ANA): NEGATIVE

## 2018-02-27 LAB — SEDIMENTATION RATE: Sed Rate: 2 mm/hr (ref 0–15)

## 2018-02-27 LAB — C-REACTIVE PROTEIN: CRP: 1 mg/L (ref 0–10)

## 2018-02-27 MED ORDER — POTASSIUM CHLORIDE ER 10 MEQ PO TBCR: 10.0000 meq | EXTENDED_RELEASE_TABLET | Freq: Every day | ORAL | 0 refills | Status: AC

## 2018-02-27 MED ORDER — POTASSIUM CHLORIDE ER 10 MEQ PO TBCR: 10.0000 meq | EXTENDED_RELEASE_TABLET | Freq: Every day | ORAL | 0 refills | Status: DC

## 2018-03-01 ENCOUNTER — Telehealth: Payer: Self-pay

## 2018-03-01 NOTE — Telephone Encounter (Signed)
Patient has a voicemail box that has not been setup yet. Will try calling once more before mailing results. Nat Christen, CMA

## 2018-03-01 NOTE — Telephone Encounter (Signed)
-----   Message from Clent Demark, PA-C sent at 02/27/2018  2:04 PM EDT ----- Labs normal except for mildly decreased potassium. I will send out potassium pills to [facility] on [address].

## 2018-03-13 ENCOUNTER — Telehealth (INDEPENDENT_AMBULATORY_CARE_PROVIDER_SITE_OTHER): Payer: Self-pay

## 2018-03-13 ENCOUNTER — Encounter (INDEPENDENT_AMBULATORY_CARE_PROVIDER_SITE_OTHER): Payer: Self-pay

## 2018-03-13 NOTE — Telephone Encounter (Signed)
-----   Message from Clent Demark, PA-C sent at 02/27/2018  2:04 PM EDT ----- Labs normal except for mildly decreased potassium. I will send out potassium pills to [facility] on [address] [facility].

## 2018-03-13 NOTE — Telephone Encounter (Signed)
Patient voicemail still not set up. Results have been mailed. Nat Christen, CMA

## 2018-03-26 ENCOUNTER — Ambulatory Visit (INDEPENDENT_AMBULATORY_CARE_PROVIDER_SITE_OTHER): Payer: BLUE CROSS/BLUE SHIELD | Admitting: Physician Assistant

## 2019-08-06 IMAGING — DX DG CHEST 2V
3 series · 3 of 3 positions shown · non-contrast
Comparison: 01/18/2017

CLINICAL DATA: Productive cough with generalized chest pain
shortness of breath with fever for days.

EXAM:
CHEST  2 VIEW

[chest pa (1 of 2)]
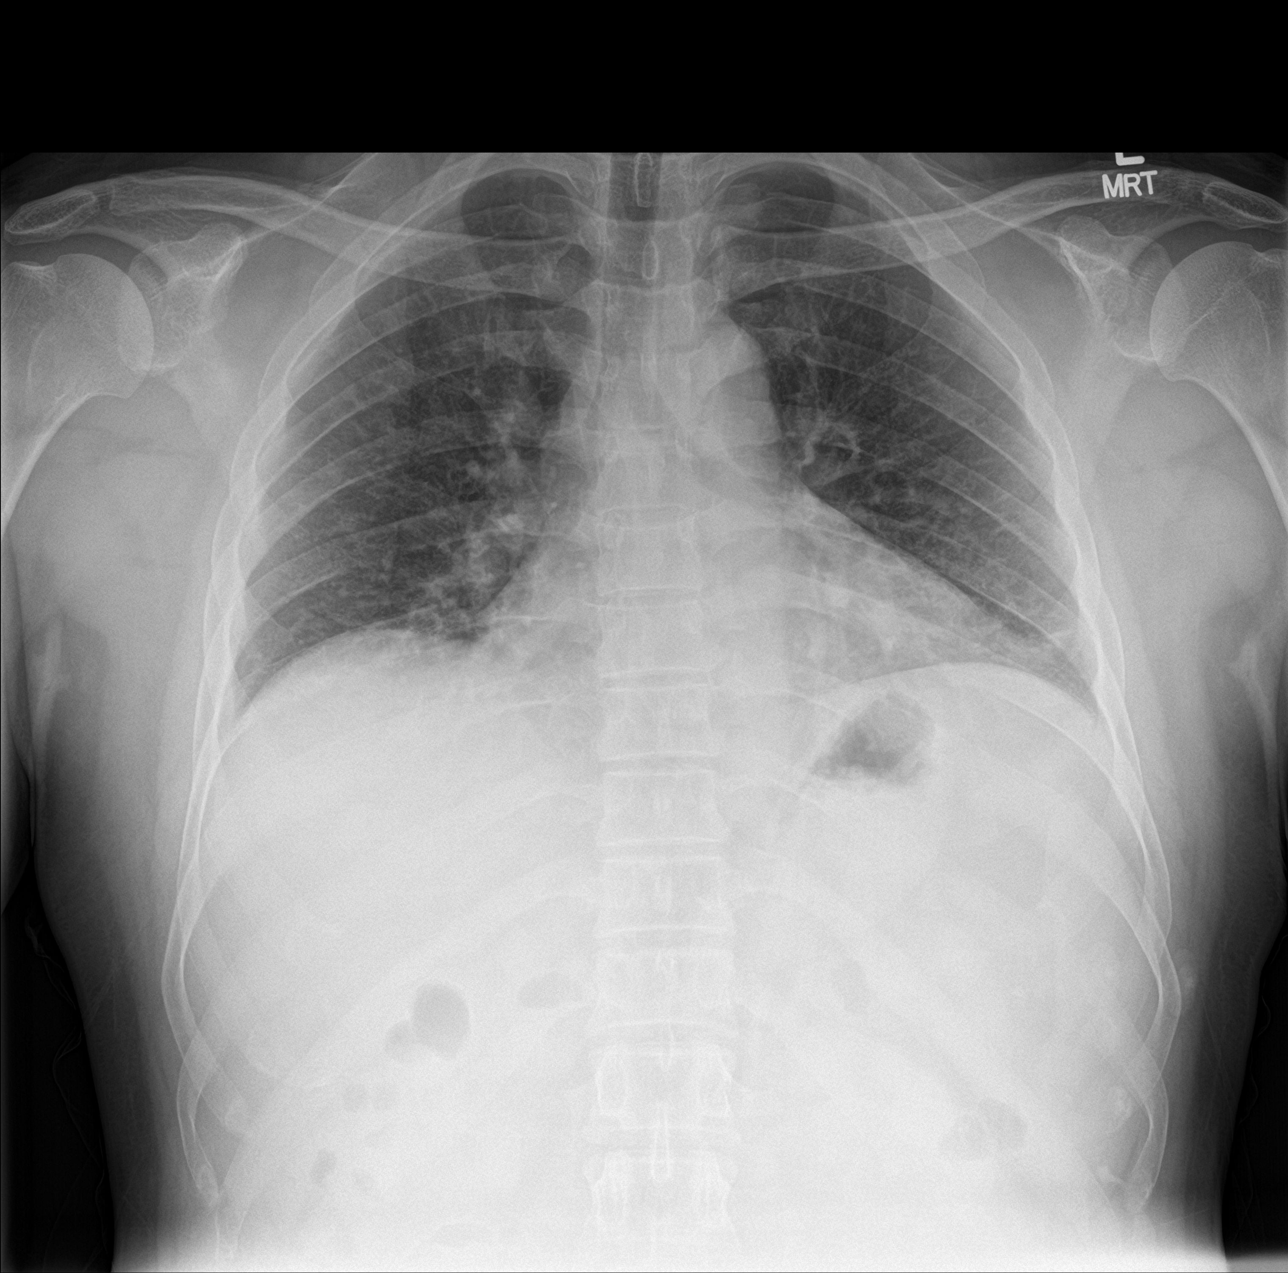

[chest lat]
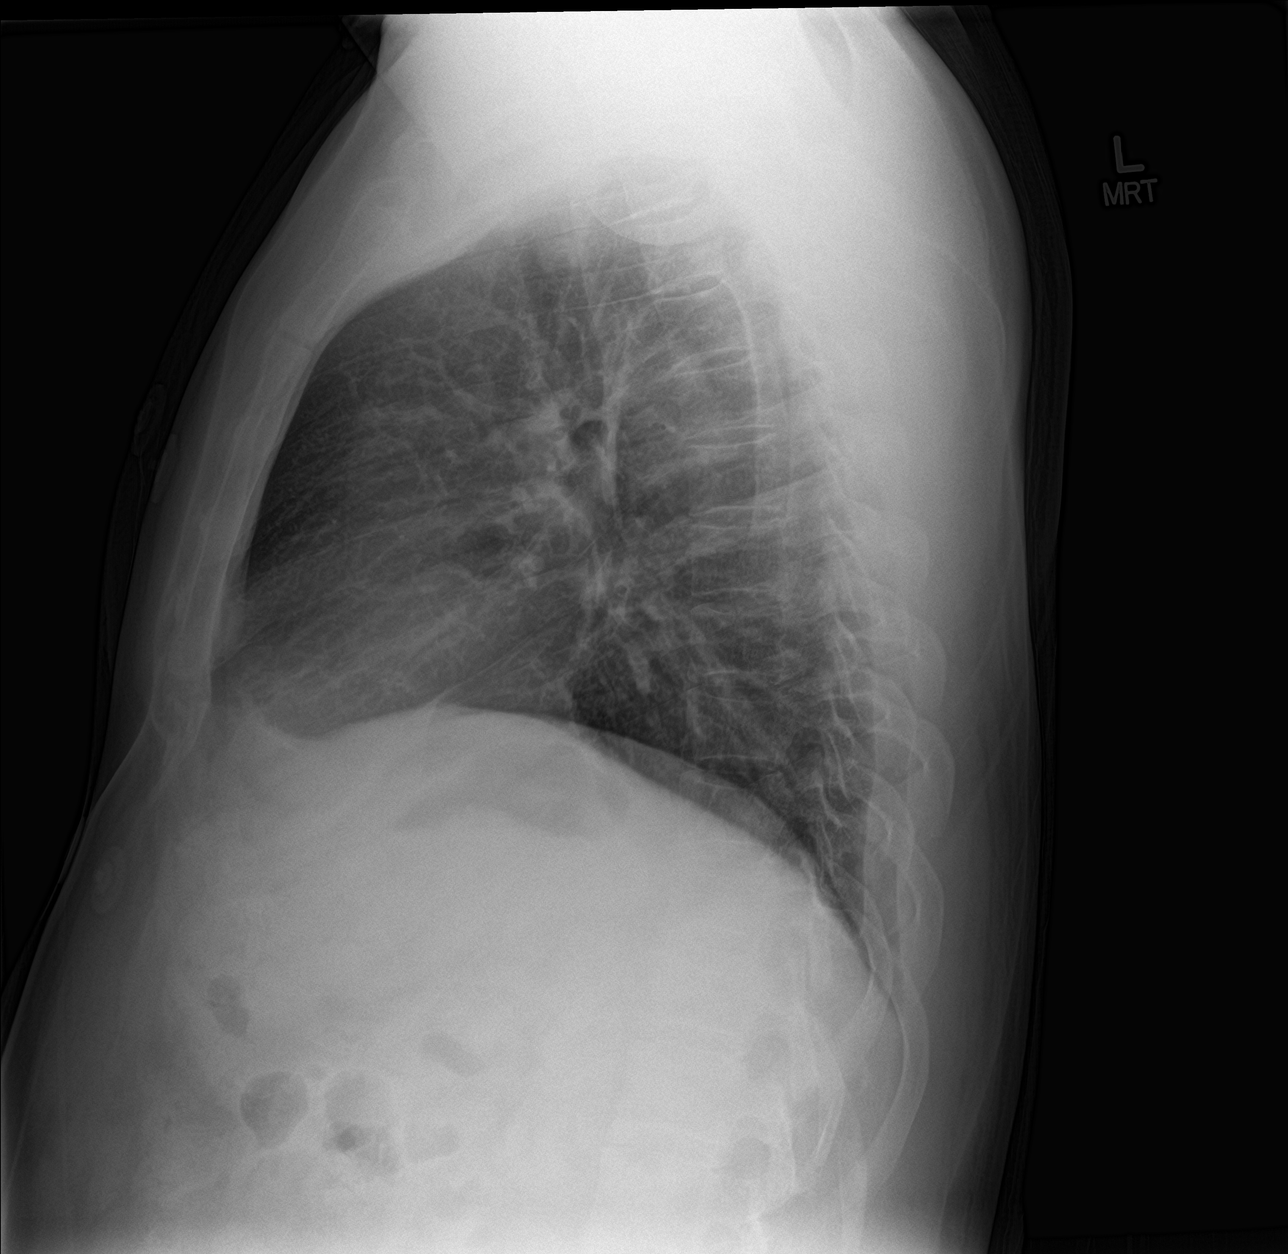

[chest pa (2 of 2)]
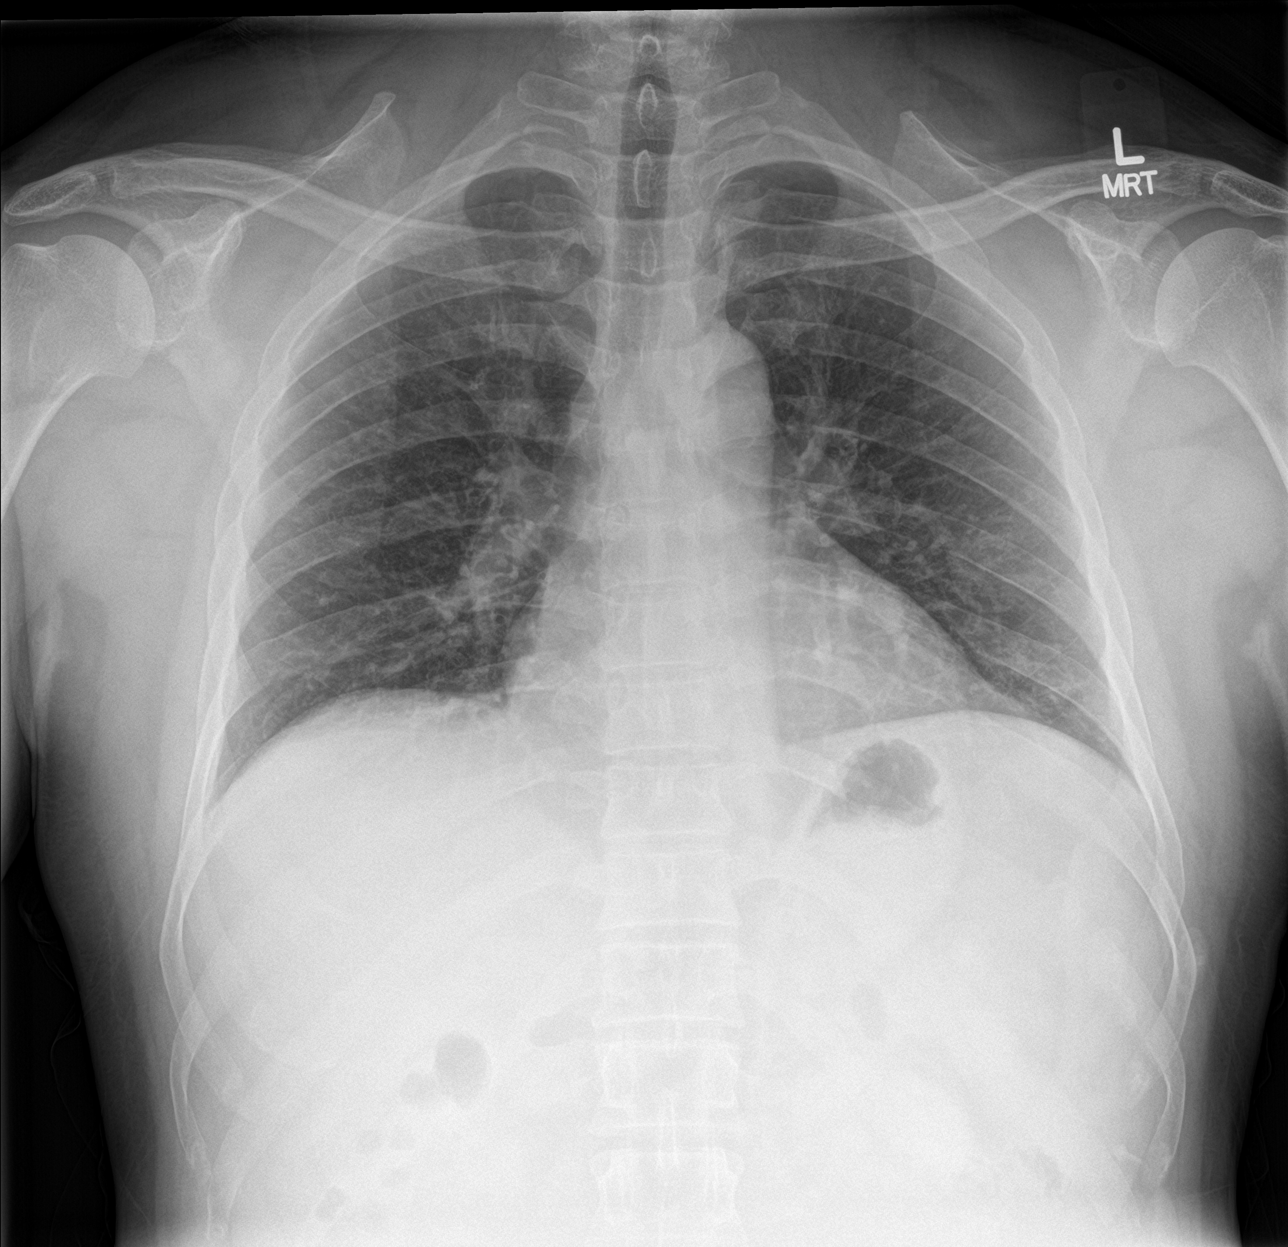

[3 of 3 positions shown; findings below may reference images not displayed]

FINDINGS: Lungs are hypoinflated without focal consolidation or effusion.
Cardiomediastinal silhouette and remainder of the chest radiograph
is unchanged.
IMPRESSION: Hypoinflation without acute cardiopulmonary disease.
